# Patient Record
Sex: Female | Born: 1945
Health system: Southern US, Community
[De-identification: ages and names within clinical notes are randomized; demographics above are authoritative.]

## PROBLEM LIST (undated history)

## (undated) DIAGNOSIS — K219 Gastro-esophageal reflux disease without esophagitis: Secondary | ICD-10-CM

## (undated) DIAGNOSIS — R000 Tachycardia, unspecified: Secondary | ICD-10-CM

## (undated) DIAGNOSIS — E785 Hyperlipidemia, unspecified: Secondary | ICD-10-CM

## (undated) DIAGNOSIS — I10 Essential (primary) hypertension: Secondary | ICD-10-CM

## (undated) DIAGNOSIS — QA00102 CACNA1A-related neurodevelopmental disorder: Secondary | ICD-10-CM

## (undated) DIAGNOSIS — G118 Other hereditary ataxias: Secondary | ICD-10-CM

## (undated) HISTORY — DX: Hyperlipidemia, unspecified: E78.5

## (undated) HISTORY — DX: Gastro-esophageal reflux disease without esophagitis: K21.9

## (undated) HISTORY — DX: Tachycardia, unspecified: R00.0

## (undated) HISTORY — PX: CATARACT EXTRACTION, BILATERAL: SHX1313

## (undated) HISTORY — DX: CACNA1A-related neurodevelopmental disorder: QA0.0102

## (undated) HISTORY — DX: Essential (primary) hypertension: I10

## (undated) HISTORY — PX: TOTAL HIP ARTHROPLASTY: SHX124

## (undated) HISTORY — DX: Other hereditary ataxias: G11.8

---

## 2014-04-25 DIAGNOSIS — H3532 Exudative age-related macular degeneration: Secondary | ICD-10-CM | POA: Diagnosis not present

## 2014-05-23 DIAGNOSIS — B354 Tinea corporis: Secondary | ICD-10-CM | POA: Diagnosis not present

## 2014-06-27 DIAGNOSIS — H3532 Exudative age-related macular degeneration: Secondary | ICD-10-CM | POA: Diagnosis not present

## 2014-06-27 DIAGNOSIS — H3531 Nonexudative age-related macular degeneration: Secondary | ICD-10-CM | POA: Diagnosis not present

## 2014-07-25 DIAGNOSIS — H25813 Combined forms of age-related cataract, bilateral: Secondary | ICD-10-CM | POA: Diagnosis not present

## 2014-08-07 DIAGNOSIS — H25813 Combined forms of age-related cataract, bilateral: Secondary | ICD-10-CM | POA: Diagnosis not present

## 2014-08-07 DIAGNOSIS — H25811 Combined forms of age-related cataract, right eye: Secondary | ICD-10-CM | POA: Diagnosis not present

## 2014-08-14 DIAGNOSIS — M1711 Unilateral primary osteoarthritis, right knee: Secondary | ICD-10-CM | POA: Diagnosis not present

## 2014-08-14 DIAGNOSIS — Z96641 Presence of right artificial hip joint: Secondary | ICD-10-CM | POA: Diagnosis not present

## 2014-08-22 DIAGNOSIS — H25811 Combined forms of age-related cataract, right eye: Secondary | ICD-10-CM | POA: Diagnosis not present

## 2014-09-05 DIAGNOSIS — H3532 Exudative age-related macular degeneration: Secondary | ICD-10-CM | POA: Diagnosis not present

## 2014-10-02 DIAGNOSIS — M1711 Unilateral primary osteoarthritis, right knee: Secondary | ICD-10-CM | POA: Diagnosis not present

## 2014-10-02 DIAGNOSIS — M1611 Unilateral primary osteoarthritis, right hip: Secondary | ICD-10-CM | POA: Diagnosis not present

## 2014-10-09 DIAGNOSIS — H25812 Combined forms of age-related cataract, left eye: Secondary | ICD-10-CM | POA: Diagnosis not present

## 2014-10-31 DIAGNOSIS — H25812 Combined forms of age-related cataract, left eye: Secondary | ICD-10-CM | POA: Diagnosis not present

## 2014-11-14 DIAGNOSIS — H3532 Exudative age-related macular degeneration: Secondary | ICD-10-CM | POA: Diagnosis not present

## 2014-11-14 DIAGNOSIS — H3531 Nonexudative age-related macular degeneration: Secondary | ICD-10-CM | POA: Diagnosis not present

## 2014-11-15 DIAGNOSIS — K219 Gastro-esophageal reflux disease without esophagitis: Secondary | ICD-10-CM | POA: Diagnosis not present

## 2014-11-15 DIAGNOSIS — I10 Essential (primary) hypertension: Secondary | ICD-10-CM | POA: Diagnosis not present

## 2014-11-15 DIAGNOSIS — Z23 Encounter for immunization: Secondary | ICD-10-CM | POA: Diagnosis not present

## 2014-11-15 DIAGNOSIS — E785 Hyperlipidemia, unspecified: Secondary | ICD-10-CM | POA: Diagnosis not present

## 2014-12-11 DIAGNOSIS — E119 Type 2 diabetes mellitus without complications: Secondary | ICD-10-CM | POA: Diagnosis not present

## 2014-12-11 DIAGNOSIS — K219 Gastro-esophageal reflux disease without esophagitis: Secondary | ICD-10-CM | POA: Diagnosis not present

## 2014-12-11 DIAGNOSIS — R0602 Shortness of breath: Secondary | ICD-10-CM | POA: Diagnosis not present

## 2014-12-11 DIAGNOSIS — I10 Essential (primary) hypertension: Secondary | ICD-10-CM | POA: Diagnosis not present

## 2014-12-11 DIAGNOSIS — Z87891 Personal history of nicotine dependence: Secondary | ICD-10-CM | POA: Diagnosis not present

## 2014-12-11 DIAGNOSIS — G119 Hereditary ataxia, unspecified: Secondary | ICD-10-CM | POA: Diagnosis not present

## 2014-12-11 DIAGNOSIS — R002 Palpitations: Secondary | ICD-10-CM | POA: Diagnosis not present

## 2014-12-13 DIAGNOSIS — R002 Palpitations: Secondary | ICD-10-CM | POA: Diagnosis not present

## 2014-12-19 DIAGNOSIS — R002 Palpitations: Secondary | ICD-10-CM | POA: Diagnosis not present

## 2015-01-01 DIAGNOSIS — M859 Disorder of bone density and structure, unspecified: Secondary | ICD-10-CM | POA: Diagnosis not present

## 2015-01-01 DIAGNOSIS — Z13820 Encounter for screening for osteoporosis: Secondary | ICD-10-CM | POA: Diagnosis not present

## 2015-01-01 DIAGNOSIS — M8580 Other specified disorders of bone density and structure, unspecified site: Secondary | ICD-10-CM | POA: Diagnosis not present

## 2015-01-18 DIAGNOSIS — R002 Palpitations: Secondary | ICD-10-CM | POA: Diagnosis not present

## 2015-01-23 DIAGNOSIS — H353212 Exudative age-related macular degeneration, right eye, with inactive choroidal neovascularization: Secondary | ICD-10-CM | POA: Diagnosis not present

## 2015-01-25 DIAGNOSIS — I471 Supraventricular tachycardia: Secondary | ICD-10-CM | POA: Diagnosis not present

## 2015-02-20 DIAGNOSIS — H9190 Unspecified hearing loss, unspecified ear: Secondary | ICD-10-CM | POA: Diagnosis not present

## 2015-02-20 DIAGNOSIS — Z23 Encounter for immunization: Secondary | ICD-10-CM | POA: Diagnosis not present

## 2015-02-28 DIAGNOSIS — H906 Mixed conductive and sensorineural hearing loss, bilateral: Secondary | ICD-10-CM | POA: Diagnosis not present

## 2015-03-15 DIAGNOSIS — R05 Cough: Secondary | ICD-10-CM | POA: Diagnosis not present

## 2015-03-15 DIAGNOSIS — H6693 Otitis media, unspecified, bilateral: Secondary | ICD-10-CM | POA: Diagnosis not present

## 2015-03-23 DIAGNOSIS — Z87891 Personal history of nicotine dependence: Secondary | ICD-10-CM | POA: Diagnosis not present

## 2015-03-23 DIAGNOSIS — J209 Acute bronchitis, unspecified: Secondary | ICD-10-CM | POA: Diagnosis not present

## 2015-03-23 DIAGNOSIS — J40 Bronchitis, not specified as acute or chronic: Secondary | ICD-10-CM | POA: Diagnosis not present

## 2015-03-23 DIAGNOSIS — R0602 Shortness of breath: Secondary | ICD-10-CM | POA: Diagnosis not present

## 2015-03-24 DIAGNOSIS — R0602 Shortness of breath: Secondary | ICD-10-CM | POA: Diagnosis not present

## 2015-03-27 DIAGNOSIS — J40 Bronchitis, not specified as acute or chronic: Secondary | ICD-10-CM | POA: Diagnosis not present

## 2015-04-03 DIAGNOSIS — H353212 Exudative age-related macular degeneration, right eye, with inactive choroidal neovascularization: Secondary | ICD-10-CM | POA: Diagnosis not present

## 2015-04-05 DIAGNOSIS — Z1231 Encounter for screening mammogram for malignant neoplasm of breast: Secondary | ICD-10-CM | POA: Diagnosis not present

## 2015-06-12 DIAGNOSIS — H353212 Exudative age-related macular degeneration, right eye, with inactive choroidal neovascularization: Secondary | ICD-10-CM | POA: Diagnosis not present

## 2015-07-11 DIAGNOSIS — G119 Hereditary ataxia, unspecified: Secondary | ICD-10-CM | POA: Diagnosis not present

## 2015-07-11 DIAGNOSIS — R278 Other lack of coordination: Secondary | ICD-10-CM | POA: Diagnosis not present

## 2015-07-11 DIAGNOSIS — M6281 Muscle weakness (generalized): Secondary | ICD-10-CM | POA: Diagnosis not present

## 2015-07-11 DIAGNOSIS — R2689 Other abnormalities of gait and mobility: Secondary | ICD-10-CM | POA: Diagnosis not present

## 2015-07-13 DIAGNOSIS — R2689 Other abnormalities of gait and mobility: Secondary | ICD-10-CM | POA: Diagnosis not present

## 2015-07-13 DIAGNOSIS — M6281 Muscle weakness (generalized): Secondary | ICD-10-CM | POA: Diagnosis not present

## 2015-07-13 DIAGNOSIS — R278 Other lack of coordination: Secondary | ICD-10-CM | POA: Diagnosis not present

## 2015-07-13 DIAGNOSIS — G119 Hereditary ataxia, unspecified: Secondary | ICD-10-CM | POA: Diagnosis not present

## 2015-07-16 DIAGNOSIS — R293 Abnormal posture: Secondary | ICD-10-CM | POA: Diagnosis not present

## 2015-07-16 DIAGNOSIS — M6281 Muscle weakness (generalized): Secondary | ICD-10-CM | POA: Diagnosis not present

## 2015-07-16 DIAGNOSIS — R278 Other lack of coordination: Secondary | ICD-10-CM | POA: Diagnosis not present

## 2015-07-16 DIAGNOSIS — G119 Hereditary ataxia, unspecified: Secondary | ICD-10-CM | POA: Diagnosis not present

## 2015-07-16 DIAGNOSIS — R2689 Other abnormalities of gait and mobility: Secondary | ICD-10-CM | POA: Diagnosis not present

## 2015-07-18 DIAGNOSIS — M6281 Muscle weakness (generalized): Secondary | ICD-10-CM | POA: Diagnosis not present

## 2015-07-18 DIAGNOSIS — R2689 Other abnormalities of gait and mobility: Secondary | ICD-10-CM | POA: Diagnosis not present

## 2015-07-18 DIAGNOSIS — R293 Abnormal posture: Secondary | ICD-10-CM | POA: Diagnosis not present

## 2015-07-18 DIAGNOSIS — G119 Hereditary ataxia, unspecified: Secondary | ICD-10-CM | POA: Diagnosis not present

## 2015-07-18 DIAGNOSIS — R278 Other lack of coordination: Secondary | ICD-10-CM | POA: Diagnosis not present

## 2015-07-19 DIAGNOSIS — R278 Other lack of coordination: Secondary | ICD-10-CM | POA: Diagnosis not present

## 2015-07-19 DIAGNOSIS — R2689 Other abnormalities of gait and mobility: Secondary | ICD-10-CM | POA: Diagnosis not present

## 2015-07-19 DIAGNOSIS — R293 Abnormal posture: Secondary | ICD-10-CM | POA: Diagnosis not present

## 2015-07-19 DIAGNOSIS — G119 Hereditary ataxia, unspecified: Secondary | ICD-10-CM | POA: Diagnosis not present

## 2015-07-19 DIAGNOSIS — M6281 Muscle weakness (generalized): Secondary | ICD-10-CM | POA: Diagnosis not present

## 2015-07-23 DIAGNOSIS — R278 Other lack of coordination: Secondary | ICD-10-CM | POA: Diagnosis not present

## 2015-07-23 DIAGNOSIS — R2689 Other abnormalities of gait and mobility: Secondary | ICD-10-CM | POA: Diagnosis not present

## 2015-07-23 DIAGNOSIS — M6281 Muscle weakness (generalized): Secondary | ICD-10-CM | POA: Diagnosis not present

## 2015-07-23 DIAGNOSIS — R293 Abnormal posture: Secondary | ICD-10-CM | POA: Diagnosis not present

## 2015-07-23 DIAGNOSIS — G119 Hereditary ataxia, unspecified: Secondary | ICD-10-CM | POA: Diagnosis not present

## 2015-07-25 DIAGNOSIS — R278 Other lack of coordination: Secondary | ICD-10-CM | POA: Diagnosis not present

## 2015-07-25 DIAGNOSIS — G119 Hereditary ataxia, unspecified: Secondary | ICD-10-CM | POA: Diagnosis not present

## 2015-07-25 DIAGNOSIS — M6281 Muscle weakness (generalized): Secondary | ICD-10-CM | POA: Diagnosis not present

## 2015-07-25 DIAGNOSIS — R2689 Other abnormalities of gait and mobility: Secondary | ICD-10-CM | POA: Diagnosis not present

## 2015-07-25 DIAGNOSIS — R293 Abnormal posture: Secondary | ICD-10-CM | POA: Diagnosis not present

## 2015-07-27 DIAGNOSIS — G119 Hereditary ataxia, unspecified: Secondary | ICD-10-CM | POA: Diagnosis not present

## 2015-07-27 DIAGNOSIS — R278 Other lack of coordination: Secondary | ICD-10-CM | POA: Diagnosis not present

## 2015-07-27 DIAGNOSIS — R293 Abnormal posture: Secondary | ICD-10-CM | POA: Diagnosis not present

## 2015-07-27 DIAGNOSIS — R2689 Other abnormalities of gait and mobility: Secondary | ICD-10-CM | POA: Diagnosis not present

## 2015-07-27 DIAGNOSIS — M6281 Muscle weakness (generalized): Secondary | ICD-10-CM | POA: Diagnosis not present

## 2015-07-30 DIAGNOSIS — R293 Abnormal posture: Secondary | ICD-10-CM | POA: Diagnosis not present

## 2015-07-30 DIAGNOSIS — R2689 Other abnormalities of gait and mobility: Secondary | ICD-10-CM | POA: Diagnosis not present

## 2015-07-30 DIAGNOSIS — R278 Other lack of coordination: Secondary | ICD-10-CM | POA: Diagnosis not present

## 2015-07-30 DIAGNOSIS — M6281 Muscle weakness (generalized): Secondary | ICD-10-CM | POA: Diagnosis not present

## 2015-07-30 DIAGNOSIS — G119 Hereditary ataxia, unspecified: Secondary | ICD-10-CM | POA: Diagnosis not present

## 2015-07-31 DIAGNOSIS — G119 Hereditary ataxia, unspecified: Secondary | ICD-10-CM | POA: Diagnosis not present

## 2015-07-31 DIAGNOSIS — R293 Abnormal posture: Secondary | ICD-10-CM | POA: Diagnosis not present

## 2015-07-31 DIAGNOSIS — R2689 Other abnormalities of gait and mobility: Secondary | ICD-10-CM | POA: Diagnosis not present

## 2015-07-31 DIAGNOSIS — R278 Other lack of coordination: Secondary | ICD-10-CM | POA: Diagnosis not present

## 2015-07-31 DIAGNOSIS — M6281 Muscle weakness (generalized): Secondary | ICD-10-CM | POA: Diagnosis not present

## 2015-08-01 DIAGNOSIS — R293 Abnormal posture: Secondary | ICD-10-CM | POA: Diagnosis not present

## 2015-08-01 DIAGNOSIS — R278 Other lack of coordination: Secondary | ICD-10-CM | POA: Diagnosis not present

## 2015-08-01 DIAGNOSIS — G119 Hereditary ataxia, unspecified: Secondary | ICD-10-CM | POA: Diagnosis not present

## 2015-08-01 DIAGNOSIS — M6281 Muscle weakness (generalized): Secondary | ICD-10-CM | POA: Diagnosis not present

## 2015-08-01 DIAGNOSIS — R2689 Other abnormalities of gait and mobility: Secondary | ICD-10-CM | POA: Diagnosis not present

## 2015-08-02 DIAGNOSIS — G119 Hereditary ataxia, unspecified: Secondary | ICD-10-CM | POA: Diagnosis not present

## 2015-08-02 DIAGNOSIS — R293 Abnormal posture: Secondary | ICD-10-CM | POA: Diagnosis not present

## 2015-08-02 DIAGNOSIS — R2689 Other abnormalities of gait and mobility: Secondary | ICD-10-CM | POA: Diagnosis not present

## 2015-08-02 DIAGNOSIS — E7800 Pure hypercholesterolemia, unspecified: Secondary | ICD-10-CM | POA: Diagnosis not present

## 2015-08-02 DIAGNOSIS — I10 Essential (primary) hypertension: Secondary | ICD-10-CM | POA: Diagnosis not present

## 2015-08-02 DIAGNOSIS — Z79899 Other long term (current) drug therapy: Secondary | ICD-10-CM | POA: Diagnosis not present

## 2015-08-02 DIAGNOSIS — R278 Other lack of coordination: Secondary | ICD-10-CM | POA: Diagnosis not present

## 2015-08-02 DIAGNOSIS — M6281 Muscle weakness (generalized): Secondary | ICD-10-CM | POA: Diagnosis not present

## 2015-08-06 DIAGNOSIS — M6281 Muscle weakness (generalized): Secondary | ICD-10-CM | POA: Diagnosis not present

## 2015-08-06 DIAGNOSIS — R278 Other lack of coordination: Secondary | ICD-10-CM | POA: Diagnosis not present

## 2015-08-06 DIAGNOSIS — R2689 Other abnormalities of gait and mobility: Secondary | ICD-10-CM | POA: Diagnosis not present

## 2015-08-06 DIAGNOSIS — R293 Abnormal posture: Secondary | ICD-10-CM | POA: Diagnosis not present

## 2015-08-06 DIAGNOSIS — G119 Hereditary ataxia, unspecified: Secondary | ICD-10-CM | POA: Diagnosis not present

## 2015-08-08 DIAGNOSIS — M6281 Muscle weakness (generalized): Secondary | ICD-10-CM | POA: Diagnosis not present

## 2015-08-08 DIAGNOSIS — R293 Abnormal posture: Secondary | ICD-10-CM | POA: Diagnosis not present

## 2015-08-08 DIAGNOSIS — G119 Hereditary ataxia, unspecified: Secondary | ICD-10-CM | POA: Diagnosis not present

## 2015-08-08 DIAGNOSIS — R278 Other lack of coordination: Secondary | ICD-10-CM | POA: Diagnosis not present

## 2015-08-08 DIAGNOSIS — R2689 Other abnormalities of gait and mobility: Secondary | ICD-10-CM | POA: Diagnosis not present

## 2015-08-10 DIAGNOSIS — R278 Other lack of coordination: Secondary | ICD-10-CM | POA: Diagnosis not present

## 2015-08-10 DIAGNOSIS — R2689 Other abnormalities of gait and mobility: Secondary | ICD-10-CM | POA: Diagnosis not present

## 2015-08-10 DIAGNOSIS — M6281 Muscle weakness (generalized): Secondary | ICD-10-CM | POA: Diagnosis not present

## 2015-08-10 DIAGNOSIS — R293 Abnormal posture: Secondary | ICD-10-CM | POA: Diagnosis not present

## 2015-08-10 DIAGNOSIS — G119 Hereditary ataxia, unspecified: Secondary | ICD-10-CM | POA: Diagnosis not present

## 2015-08-13 DIAGNOSIS — M6281 Muscle weakness (generalized): Secondary | ICD-10-CM | POA: Diagnosis not present

## 2015-08-13 DIAGNOSIS — G119 Hereditary ataxia, unspecified: Secondary | ICD-10-CM | POA: Diagnosis not present

## 2015-08-13 DIAGNOSIS — R2689 Other abnormalities of gait and mobility: Secondary | ICD-10-CM | POA: Diagnosis not present

## 2015-08-13 DIAGNOSIS — R278 Other lack of coordination: Secondary | ICD-10-CM | POA: Diagnosis not present

## 2015-08-13 DIAGNOSIS — R293 Abnormal posture: Secondary | ICD-10-CM | POA: Diagnosis not present

## 2015-08-16 DIAGNOSIS — M6281 Muscle weakness (generalized): Secondary | ICD-10-CM | POA: Diagnosis not present

## 2015-08-16 DIAGNOSIS — R293 Abnormal posture: Secondary | ICD-10-CM | POA: Diagnosis not present

## 2015-08-16 DIAGNOSIS — R278 Other lack of coordination: Secondary | ICD-10-CM | POA: Diagnosis not present

## 2015-08-16 DIAGNOSIS — R2689 Other abnormalities of gait and mobility: Secondary | ICD-10-CM | POA: Diagnosis not present

## 2015-08-16 DIAGNOSIS — G119 Hereditary ataxia, unspecified: Secondary | ICD-10-CM | POA: Diagnosis not present

## 2015-08-20 DIAGNOSIS — M6281 Muscle weakness (generalized): Secondary | ICD-10-CM | POA: Diagnosis not present

## 2015-08-20 DIAGNOSIS — R2689 Other abnormalities of gait and mobility: Secondary | ICD-10-CM | POA: Diagnosis not present

## 2015-08-20 DIAGNOSIS — R293 Abnormal posture: Secondary | ICD-10-CM | POA: Diagnosis not present

## 2015-08-20 DIAGNOSIS — G119 Hereditary ataxia, unspecified: Secondary | ICD-10-CM | POA: Diagnosis not present

## 2015-08-20 DIAGNOSIS — R278 Other lack of coordination: Secondary | ICD-10-CM | POA: Diagnosis not present

## 2015-08-21 DIAGNOSIS — H353212 Exudative age-related macular degeneration, right eye, with inactive choroidal neovascularization: Secondary | ICD-10-CM | POA: Diagnosis not present

## 2015-08-21 DIAGNOSIS — H353122 Nonexudative age-related macular degeneration, left eye, intermediate dry stage: Secondary | ICD-10-CM | POA: Diagnosis not present

## 2015-08-22 DIAGNOSIS — R278 Other lack of coordination: Secondary | ICD-10-CM | POA: Diagnosis not present

## 2015-08-22 DIAGNOSIS — R293 Abnormal posture: Secondary | ICD-10-CM | POA: Diagnosis not present

## 2015-08-22 DIAGNOSIS — M6281 Muscle weakness (generalized): Secondary | ICD-10-CM | POA: Diagnosis not present

## 2015-08-22 DIAGNOSIS — R2689 Other abnormalities of gait and mobility: Secondary | ICD-10-CM | POA: Diagnosis not present

## 2015-08-22 DIAGNOSIS — G119 Hereditary ataxia, unspecified: Secondary | ICD-10-CM | POA: Diagnosis not present

## 2015-08-24 DIAGNOSIS — R293 Abnormal posture: Secondary | ICD-10-CM | POA: Diagnosis not present

## 2015-08-24 DIAGNOSIS — R2689 Other abnormalities of gait and mobility: Secondary | ICD-10-CM | POA: Diagnosis not present

## 2015-08-24 DIAGNOSIS — R278 Other lack of coordination: Secondary | ICD-10-CM | POA: Diagnosis not present

## 2015-08-24 DIAGNOSIS — G119 Hereditary ataxia, unspecified: Secondary | ICD-10-CM | POA: Diagnosis not present

## 2015-08-24 DIAGNOSIS — M6281 Muscle weakness (generalized): Secondary | ICD-10-CM | POA: Diagnosis not present

## 2015-08-27 DIAGNOSIS — R293 Abnormal posture: Secondary | ICD-10-CM | POA: Diagnosis not present

## 2015-08-27 DIAGNOSIS — R2689 Other abnormalities of gait and mobility: Secondary | ICD-10-CM | POA: Diagnosis not present

## 2015-08-27 DIAGNOSIS — G119 Hereditary ataxia, unspecified: Secondary | ICD-10-CM | POA: Diagnosis not present

## 2015-08-27 DIAGNOSIS — R278 Other lack of coordination: Secondary | ICD-10-CM | POA: Diagnosis not present

## 2015-08-27 DIAGNOSIS — M6281 Muscle weakness (generalized): Secondary | ICD-10-CM | POA: Diagnosis not present

## 2015-11-27 DIAGNOSIS — H353122 Nonexudative age-related macular degeneration, left eye, intermediate dry stage: Secondary | ICD-10-CM | POA: Diagnosis not present

## 2015-11-27 DIAGNOSIS — H353212 Exudative age-related macular degeneration, right eye, with inactive choroidal neovascularization: Secondary | ICD-10-CM | POA: Diagnosis not present

## 2015-11-29 DIAGNOSIS — I10 Essential (primary) hypertension: Secondary | ICD-10-CM | POA: Diagnosis not present

## 2015-11-29 DIAGNOSIS — Z6841 Body Mass Index (BMI) 40.0 and over, adult: Secondary | ICD-10-CM | POA: Diagnosis not present

## 2015-11-29 DIAGNOSIS — G119 Hereditary ataxia, unspecified: Secondary | ICD-10-CM | POA: Diagnosis not present

## 2015-11-29 DIAGNOSIS — E7800 Pure hypercholesterolemia, unspecified: Secondary | ICD-10-CM | POA: Diagnosis not present

## 2015-11-29 DIAGNOSIS — Z79899 Other long term (current) drug therapy: Secondary | ICD-10-CM | POA: Diagnosis not present

## 2016-01-22 DIAGNOSIS — H353212 Exudative age-related macular degeneration, right eye, with inactive choroidal neovascularization: Secondary | ICD-10-CM | POA: Diagnosis not present

## 2016-01-23 DIAGNOSIS — Z23 Encounter for immunization: Secondary | ICD-10-CM | POA: Diagnosis not present

## 2016-03-27 DIAGNOSIS — I10 Essential (primary) hypertension: Secondary | ICD-10-CM | POA: Diagnosis not present

## 2016-03-27 DIAGNOSIS — Z6838 Body mass index (BMI) 38.0-38.9, adult: Secondary | ICD-10-CM | POA: Diagnosis not present

## 2016-03-27 DIAGNOSIS — G119 Hereditary ataxia, unspecified: Secondary | ICD-10-CM | POA: Diagnosis not present

## 2016-03-27 DIAGNOSIS — E7800 Pure hypercholesterolemia, unspecified: Secondary | ICD-10-CM | POA: Diagnosis not present

## 2016-03-27 DIAGNOSIS — E669 Obesity, unspecified: Secondary | ICD-10-CM | POA: Diagnosis not present

## 2016-03-27 DIAGNOSIS — R000 Tachycardia, unspecified: Secondary | ICD-10-CM | POA: Diagnosis not present

## 2016-03-27 DIAGNOSIS — N3941 Urge incontinence: Secondary | ICD-10-CM | POA: Diagnosis not present

## 2016-03-27 DIAGNOSIS — Z1389 Encounter for screening for other disorder: Secondary | ICD-10-CM | POA: Diagnosis not present

## 2016-03-27 DIAGNOSIS — Z0000 Encounter for general adult medical examination without abnormal findings: Secondary | ICD-10-CM | POA: Diagnosis not present

## 2016-04-22 DIAGNOSIS — H353212 Exudative age-related macular degeneration, right eye, with inactive choroidal neovascularization: Secondary | ICD-10-CM | POA: Diagnosis not present

## 2016-05-07 DIAGNOSIS — J069 Acute upper respiratory infection, unspecified: Secondary | ICD-10-CM | POA: Diagnosis not present

## 2016-07-01 DIAGNOSIS — D1801 Hemangioma of skin and subcutaneous tissue: Secondary | ICD-10-CM | POA: Diagnosis not present

## 2016-07-01 DIAGNOSIS — L821 Other seborrheic keratosis: Secondary | ICD-10-CM | POA: Diagnosis not present

## 2016-07-01 DIAGNOSIS — L720 Epidermal cyst: Secondary | ICD-10-CM | POA: Diagnosis not present

## 2016-07-08 DIAGNOSIS — I10 Essential (primary) hypertension: Secondary | ICD-10-CM | POA: Diagnosis not present

## 2016-07-08 DIAGNOSIS — G119 Hereditary ataxia, unspecified: Secondary | ICD-10-CM | POA: Diagnosis not present

## 2016-07-08 DIAGNOSIS — E669 Obesity, unspecified: Secondary | ICD-10-CM | POA: Diagnosis not present

## 2016-09-11 DIAGNOSIS — D3132 Benign neoplasm of left choroid: Secondary | ICD-10-CM | POA: Diagnosis not present

## 2016-09-11 DIAGNOSIS — H353213 Exudative age-related macular degeneration, right eye, with inactive scar: Secondary | ICD-10-CM | POA: Diagnosis not present

## 2016-09-11 DIAGNOSIS — H353122 Nonexudative age-related macular degeneration, left eye, intermediate dry stage: Secondary | ICD-10-CM | POA: Diagnosis not present

## 2016-09-11 DIAGNOSIS — H35372 Puckering of macula, left eye: Secondary | ICD-10-CM | POA: Diagnosis not present

## 2016-09-25 DIAGNOSIS — Z0000 Encounter for general adult medical examination without abnormal findings: Secondary | ICD-10-CM | POA: Diagnosis not present

## 2016-09-25 DIAGNOSIS — I10 Essential (primary) hypertension: Secondary | ICD-10-CM | POA: Diagnosis not present

## 2016-09-25 DIAGNOSIS — Z79899 Other long term (current) drug therapy: Secondary | ICD-10-CM | POA: Diagnosis not present

## 2016-09-25 DIAGNOSIS — G119 Hereditary ataxia, unspecified: Secondary | ICD-10-CM | POA: Diagnosis not present

## 2016-10-02 ENCOUNTER — Encounter: Payer: Self-pay | Admitting: Neurology

## 2016-10-30 DIAGNOSIS — D126 Benign neoplasm of colon, unspecified: Secondary | ICD-10-CM | POA: Diagnosis not present

## 2016-10-31 ENCOUNTER — Encounter: Payer: Self-pay | Admitting: Neurology

## 2016-10-31 NOTE — Progress Notes (Signed)
Andrea Arroyo was seen today in the movement disorders clinic for neurologic consultation at the request of [name], MD.  The consultation is for the evaluation of cerebellar ataxia.  She has apparently been evaluated for the same previously in [place] but I don't have any of those records.  Pt states that the first sx came on when she was about 71 years old.  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  Over a few years, sx's progressed to involve speech and significant walking trouble.  She has not been able to walk unassisted since about 2003.  She fell and fx the right hip about 7 years ago and husband states that she was able to get back to her baseline thereafter.  She lives independently with her husband at [place].  She has no trouble swallowing but does state that "I have a lot of coughing with liquids or with watermelon."  Husband says that she does well with taking her medication.  She has no diplopia.  She does have macular degeneration.    In order to get the bathroom, she ambulates with her husband with a walker (rollator).  She also has a power WC which she uses at [employer].  She is able to steer straight with the power wheelchair, but is having some trouble when she has to steer around objects or people are with her, primarily because of the ataxia when she has to grab for control.  She has no cramping of the limbs.  No paresthesias.  Pool 2 days a week for exercise and is going to be starting PT.  Has had ST in the past and she doesn't think that it was helpful.    Reports that her mother had similar sx's but they called it MS.  Her maternal uncle, 2 maternal aunts and her brother all have same sx's.  She has 3 children and none of them have been affected (at this point).    ALLERGIES:   Allergies  Allergen Reactions  . Penicillins Rash    CURRENT MEDICATIONS:  Outpatient Encounter Prescriptions as of 11/03/2016    Medication Sig  . CALCIUM PO Take by mouth daily.  . Cholecalciferol (VITAMIN D PO) Take 5,000 Units by mouth daily.  Marland Kitchen losartan (COZAAR) 50 MG tablet Take 50 mg by mouth daily.  . multivitamin-lutein (OCUVITE-LUTEIN) CAPS capsule Take 1 capsule by mouth daily.  Marland Kitchen MYRBETRIQ 25 MG TB24 tablet Take 25 mg by mouth daily.  Marland Kitchen omeprazole (PRILOSEC) 20 MG capsule Take 20 mg by mouth daily.  . simvastatin (ZOCOR) 20 MG tablet Take 20 mg by mouth daily.  Marland Kitchen triamcinolone ointment (KENALOG) 0.1 % Apply 1 application topically 2 (two) times daily.  . verapamil (CALAN-SR) 240 MG CR tablet Take 240 mg by mouth at bedtime.   No facility-administered encounter medications on file as of 11/03/2016.     PAST MEDICAL HISTORY:   Past Medical History:  Diagnosis Date  . GERD (gastroesophageal reflux disease)   . Hyperlipidemia   . Hypertension   . Rapid heart rate   . SCA-6 (spinocerebellar ataxia type 6) ([place])     PAST SURGICAL HISTORY:   Past Surgical History:  Procedure Laterality Date  . CATARACT EXTRACTION, BILATERAL    . TOTAL HIP ARTHROPLASTY Right     SOCIAL HISTORY:   Social History   Social History  . Marital status: Married    Spouse name: N/A  .  Number of children: N/A  . Years of education: N/A   Occupational History  . retired     Scientist, research (medical); [employer] fundraiser   Social History Main Topics  . Smoking status: Former Smoker    Quit date: 11/04/2006  . Smokeless tobacco: Never Used  . Alcohol use No  . Drug use: No  . Sexual activity: Not on file   Other Topics Concern  . Not on file   Social History Narrative  . No narrative on file    FAMILY HISTORY:   Family Status  Relation Status  . Mother Deceased       SCA 6  . Father Deceased  . Sister Alive  . Brother Alive       SCA 6  . Child Alive    ROS:  A complete 10 system review of systems was obtained and was unremarkable apart from what is mentioned above.  PHYSICAL EXAMINATION:    VITALS:   Vitals:    11/03/16 1411  BP: 118/62  Pulse: 60  SpO2: 95%  Weight: 221 lb (100.2 kg)  Height: 5\' 3"  (1.6 m)    GEN:  The patient appears stated age and is in NAD. HEENT:  Normocephalic, atraumatic.  The mucous membranes are moist. The superficial temporal arteries are without ropiness or tenderness. CV:  RRR Lungs:  CTAB Neck/HEME:  There are no carotid bruits bilaterally.  Neurological examination:  Orientation: The patient is alert and oriented x3. Fund of knowledge is appropriate.  Recent and remote memory are intact.  Attention and concentration are normal.    Able to name objects and repeat phrases. Cranial nerves: There is good facial symmetry. Pupils are equal round and reactive to light bilaterally. Fundoscopic exam reveals clear margins bilaterally. Extraocular muscles are intact.  There is overshoot dysmetria.  There is horizontal nystagmus.  The visual fields are full to confrontational testing. The speech is fluent and pseudobulbar in quality. Soft palate rises symmetrically and there is no tongue deviation. Hearing is intact to conversational tone. Sensation: Sensation is intact to light and pinprick throughout (facial, trunk, extremities). Vibration is intact at the bilateral big toe. There is no extinction with double simultaneous stimulation. There is no sensory dermatomal level identified. Motor: Strength is 5/5 in the bilateral upper and lower extremities.   Shoulder shrug is equal and symmetric.  There is no pronator drift. Deep tendon reflexes: Deep tendon reflexes are 2+/4 at the bilateral biceps, triceps, brachioradialis, patella and achilles. Plantar responses are downgoing bilaterally.  Movement examination: Tone: There is normal tone in the bilateral upper extremities.  The tone in the lower extremities is normal.  Abnormal movements: None Coordination:  There is no decremation with RAM's, with any form of RAMS, including alternating supination and pronation of the forearm,  hand opening and closing, finger taps, heel taps and toe taps.  She does have significant dysmetria with finger-nose-finger bilaterally that is somewhat asymmetric with the right being worse than the left.  She has trouble with H-S but is able to do it Gait and Station: The patient arises out of the wheelchair by pushing off.  She is given a walker.  She is ataxic even with the walker and requires someone to walk behind her and gently guide her.  ASSESSMENT/PLAN:  1.  Spinocerebellar ataxia type 6  -Discussed the nature and pathophysiology with the patient and her husband.  They understand that this is autosomal dominant.  She has had this for over 20 years.  This has been fairly slowly progressive for her.  She is being very well taking care of by her husband.  They live at [facility], but lived independently and wished to remain that way for as long as they can.  She is in physical therapy.  She has not found speech therapy to be helpful in the past.  She has some pseudobulbar speech and they will let me know if he changes her mind and speech therapy.  I really would like to get a modified barium swallow study given coughing with liquids, but she wants to hold on that.  We talked about risks for aspiration.  They understood.    -I would like to see if we can get her power wheelchair modified somewhat.  It is doubtful that Medicare will allow for this, but it would be of great value for her.  She has such ataxia with the arms now that she did not have in the past that when she is not going straight and needs to maneuver through smaller spaces or people, she is hitting things and is very embarrassed and wants to stay inside because of that.  I would like to see if we can get her chair modified so that her husband can control the chair just in those situations.  Otherwise, she generally has noted difficulty controlling her own chair.  -Her husband talked about mood fluctuations.  We talked about various mood  medications, but ultimately they both decided that nothing was necessary.  They will let me know if they change their mind.  -I do think she has some component of pseudobulbar laughter, which can come with this.  This is not at all bothersome.  We discussed the nature of this today.  2.  She was being followed in [place] yearly.  They would like to continue that here.  They will certainly let me know if new neurologic issues arise.  I will try to get her records from [place].  She was seen at [facility].  Much greater than 50% of this visit was spent in counseling and coordinating care.  Total face to face time:  65 min   Cc:  [name], MD

## 2016-11-03 ENCOUNTER — Encounter: Payer: Self-pay | Admitting: Neurology

## 2016-11-03 ENCOUNTER — Ambulatory Visit (INDEPENDENT_AMBULATORY_CARE_PROVIDER_SITE_OTHER): Payer: Medicare Other | Admitting: Neurology

## 2016-11-03 VITALS — BP 118/62 | HR 60 | Ht 63.0 in | Wt 221.0 lb

## 2016-11-03 DIAGNOSIS — G118 Other hereditary ataxias: Secondary | ICD-10-CM

## 2016-11-04 ENCOUNTER — Telehealth: Payer: Self-pay | Admitting: Neurology

## 2016-11-04 NOTE — Telephone Encounter (Signed)
Spoke with Debbie at [facility] about adding buddy controls on the back of patient's wheelchair since her ataxia sometimes makes it impossible for her to steer the chair effectively. She states to send over information and she would work on it, but insurance rarely pays for this.   Referral for power wheelchair adjustment evaluation faxed to [place] at [facility] at [phone] with confirmation received. She will call patient to discuss.

## 2016-11-24 DIAGNOSIS — K648 Other hemorrhoids: Secondary | ICD-10-CM | POA: Diagnosis not present

## 2016-11-24 DIAGNOSIS — K635 Polyp of colon: Secondary | ICD-10-CM | POA: Diagnosis not present

## 2016-11-24 DIAGNOSIS — D126 Benign neoplasm of colon, unspecified: Secondary | ICD-10-CM | POA: Diagnosis not present

## 2016-11-24 DIAGNOSIS — Z86010 Personal history of colonic polyps: Secondary | ICD-10-CM | POA: Diagnosis not present

## 2016-11-25 DIAGNOSIS — K635 Polyp of colon: Secondary | ICD-10-CM | POA: Diagnosis not present

## 2016-11-25 DIAGNOSIS — D126 Benign neoplasm of colon, unspecified: Secondary | ICD-10-CM | POA: Diagnosis not present

## 2016-12-04 DIAGNOSIS — R293 Abnormal posture: Secondary | ICD-10-CM | POA: Diagnosis not present

## 2016-12-04 DIAGNOSIS — R29818 Other symptoms and signs involving the nervous system: Secondary | ICD-10-CM | POA: Diagnosis not present

## 2016-12-04 DIAGNOSIS — R2689 Other abnormalities of gait and mobility: Secondary | ICD-10-CM | POA: Diagnosis not present

## 2016-12-04 DIAGNOSIS — G119 Hereditary ataxia, unspecified: Secondary | ICD-10-CM | POA: Diagnosis not present

## 2016-12-09 DIAGNOSIS — D3132 Benign neoplasm of left choroid: Secondary | ICD-10-CM | POA: Diagnosis not present

## 2016-12-09 DIAGNOSIS — H353121 Nonexudative age-related macular degeneration, left eye, early dry stage: Secondary | ICD-10-CM | POA: Diagnosis not present

## 2016-12-09 DIAGNOSIS — H43813 Vitreous degeneration, bilateral: Secondary | ICD-10-CM | POA: Diagnosis not present

## 2016-12-09 DIAGNOSIS — H353213 Exudative age-related macular degeneration, right eye, with inactive scar: Secondary | ICD-10-CM | POA: Diagnosis not present

## 2016-12-11 DIAGNOSIS — R2689 Other abnormalities of gait and mobility: Secondary | ICD-10-CM | POA: Diagnosis not present

## 2016-12-11 DIAGNOSIS — G119 Hereditary ataxia, unspecified: Secondary | ICD-10-CM | POA: Diagnosis not present

## 2016-12-11 DIAGNOSIS — R293 Abnormal posture: Secondary | ICD-10-CM | POA: Diagnosis not present

## 2016-12-11 DIAGNOSIS — R29818 Other symptoms and signs involving the nervous system: Secondary | ICD-10-CM | POA: Diagnosis not present

## 2016-12-16 DIAGNOSIS — R293 Abnormal posture: Secondary | ICD-10-CM | POA: Diagnosis not present

## 2016-12-16 DIAGNOSIS — G119 Hereditary ataxia, unspecified: Secondary | ICD-10-CM | POA: Diagnosis not present

## 2016-12-16 DIAGNOSIS — R29818 Other symptoms and signs involving the nervous system: Secondary | ICD-10-CM | POA: Diagnosis not present

## 2016-12-16 DIAGNOSIS — R2689 Other abnormalities of gait and mobility: Secondary | ICD-10-CM | POA: Diagnosis not present

## 2016-12-18 DIAGNOSIS — G119 Hereditary ataxia, unspecified: Secondary | ICD-10-CM | POA: Diagnosis not present

## 2016-12-18 DIAGNOSIS — R2689 Other abnormalities of gait and mobility: Secondary | ICD-10-CM | POA: Diagnosis not present

## 2016-12-18 DIAGNOSIS — R293 Abnormal posture: Secondary | ICD-10-CM | POA: Diagnosis not present

## 2016-12-18 DIAGNOSIS — R29818 Other symptoms and signs involving the nervous system: Secondary | ICD-10-CM | POA: Diagnosis not present

## 2016-12-23 DIAGNOSIS — R2689 Other abnormalities of gait and mobility: Secondary | ICD-10-CM | POA: Diagnosis not present

## 2016-12-23 DIAGNOSIS — R29818 Other symptoms and signs involving the nervous system: Secondary | ICD-10-CM | POA: Diagnosis not present

## 2016-12-23 DIAGNOSIS — R293 Abnormal posture: Secondary | ICD-10-CM | POA: Diagnosis not present

## 2016-12-23 DIAGNOSIS — G119 Hereditary ataxia, unspecified: Secondary | ICD-10-CM | POA: Diagnosis not present

## 2017-01-02 NOTE — Progress Notes (Signed)
Andrea Arroyo was seen today in the movement disorders clinic for neurologic consultation at the request of [name], MD.  The consultation is for the evaluation of cerebellar ataxia.  She has apparently been evaluated for the same previously in [place] but I don't have any of those records.  Pt states that the first sx came on when she was about 71 years old.  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  Over a few years, sx's progressed to involve speech and significant walking trouble.  She has not been able to walk unassisted since about 2003.  She fell and fx the right hip about 7 years ago and husband states that she was able to get back to her baseline thereafter.  She lives independently with her husband at [place].  She has no trouble swallowing but does state that "I have a lot of coughing with liquids or with watermelon."  Husband says that she does well with taking her medication.  She has no diplopia.  She does have macular degeneration.    In order to get the bathroom, she ambulates with her husband with a walker (rollator).  She also has a power WC which she uses at [employer].  She is able to steer straight with the power wheelchair, but is having some trouble when she has to steer around objects or people are with her, primarily because of the ataxia when she has to grab for control.  She has no cramping of the limbs.  No paresthesias.  Pool 2 days a week for exercise and is going to be starting PT.  Has had ST in the past and she doesn't think that it was helpful.    Reports that her mother had similar sx's but they called it MS.  Her maternal uncle, 2 maternal aunts and her brother all have same sx's.  She has 3 children and none of them have been affected (at this point).  01/05/17 update:  Here for power WC eval.  This patient is accompanied in the office by her spouse who supplements the history.  Patient presents for a mobility  evaluation.  I have reviewed and agree with PT evaluation.  Pt needs power WC as she has difficulty getting to bathroom on time.  Her battery on her own chair has warn out after 6 years. Pt has the mental capability to use the power WC and has demonstrated that with the PT.  she cannot use manual WC because of endurance abilities to propel and poor coordination of UE's given her SCA.  .she could not mount a scooter/transfer onto safely, hence making it inappropriate for this patient.   Rollator not appropriate as multiple falls with this and traditional walker.   Pt has the mental capability to use the power WC and has demonstrated that.  she cannot use manual WC because of endurance abilities to propel and poor coordination of UE's. she could not mount a scooter/transfer onto safely, hence making it inappropriate for this patient.   Rollator not appropriate as multiple falls with this and traditional walker.   ALLERGIES:   Allergies  Allergen Reactions  . Penicillins Rash    CURRENT MEDICATIONS:  Outpatient Encounter Prescriptions as of 01/05/2017  Medication Sig  . CALCIUM PO Take by mouth daily.  . Cholecalciferol (VITAMIN D PO) Take 5,000 Units by mouth daily.  Marland Kitchen losartan (COZAAR) 50 MG tablet Take 50 mg by  mouth daily.  . multivitamin-lutein (OCUVITE-LUTEIN) CAPS capsule Take 1 capsule by mouth daily.  Marland Kitchen MYRBETRIQ 25 MG TB24 tablet Take 25 mg by mouth daily.  Marland Kitchen omeprazole (PRILOSEC) 20 MG capsule Take 20 mg by mouth daily.  . simvastatin (ZOCOR) 20 MG tablet Take 20 mg by mouth daily.  Marland Kitchen triamcinolone ointment (KENALOG) 0.1 % Apply 1 application topically 2 (two) times daily.  . verapamil (CALAN-SR) 240 MG CR tablet Take 240 mg by mouth at bedtime.   No facility-administered encounter medications on file as of 01/05/2017.     PAST MEDICAL HISTORY:   Past Medical History:  Diagnosis Date  . GERD (gastroesophageal reflux disease)   . Hyperlipidemia   . Hypertension   . Rapid heart  rate   . SCA-6 (spinocerebellar ataxia type 6) ([place])     PAST SURGICAL HISTORY:   Past Surgical History:  Procedure Laterality Date  . CATARACT EXTRACTION, BILATERAL    . TOTAL HIP ARTHROPLASTY Right     SOCIAL HISTORY:   Social History   Social History  . Marital status: Married    Spouse name: N/A  . Number of children: N/A  . Years of education: N/A   Occupational History  . retired     Scientist, research (medical); [employer] fundraiser   Social History Main Topics  . Smoking status: Former Smoker    Quit date: 11/04/2006  . Smokeless tobacco: Never Used  . Alcohol use No  . Drug use: No  . Sexual activity: Not on file   Other Topics Concern  . Not on file   Social History Narrative  . No narrative on file    FAMILY HISTORY:   Family Status  Relation Status  . Mother Deceased       SCA 6  . Father Deceased  . Sister Alive  . Brother Alive       SCA 6  . Child Alive    ROS:  A complete 10 system review of systems was obtained and was unremarkable apart from what is mentioned above.  PHYSICAL EXAMINATION:    VITALS:   Vitals:   01/05/17 1413  BP: 120/70  Pulse: 60  SpO2: 95%    GEN:  The patient appears stated age and is in NAD. HEENT:  Normocephalic, atraumatic.  The mucous membranes are moist. The superficial temporal arteries are without ropiness or tenderness. CV:  RRR Lungs:  CTAB Neck/HEME:  There are no carotid bruits bilaterally.  Neurological examination:  Orientation: The patient is alert and oriented x3. Fund of knowledge is appropriate.  Recent and remote memory are intact.  Attention and concentration are normal.    Able to name objects and repeat phrases. Cranial nerves: There is good facial symmetry. Pupils are equal round and reactive to light bilaterally. Fundoscopic exam reveals clear margins bilaterally. Extraocular muscles are intact.  There is overshoot dysmetria.  There is horizontal nystagmus.  The visual fields are full to confrontational testing. The  speech is fluent and pseudobulbar in quality. Soft palate rises symmetrically and there is no tongue deviation. Hearing is intact to conversational tone. Sensation: Sensation is intact to light and pinprick throughout (facial, trunk, extremities). Vibration is intact at the bilateral big toe. There is no extinction with double simultaneous stimulation. There is no sensory dermatomal level identified. Motor: Strength is 5/5 in the bilateral upper and lower extremities.   Shoulder shrug is equal and symmetric.  There is no pronator drift.  Movement examination: Tone: There is normal tone  in the bilateral upper extremities.  The tone in the lower extremities is normal.  Abnormal movements: None Coordination:  There is no decremation with RAM's, with any form of RAMS, including alternating supination and pronation of the forearm, hand opening and closing, finger taps, heel taps and toe taps.  She does have significant dysmetria with finger-nose-finger bilaterally that is somewhat asymmetric with the right being worse than the left.  She has trouble with H-S but is able to do it Gait and Station: The patient arises out of the wheelchair by pushing off.  She is given a walker.  She is ataxic but she does require my assistance.    ASSESSMENT/PLAN:  1.  Spinocerebellar ataxia type 6  -Discussed the nature and pathophysiology with the patient and her husband.  They understand that this is autosomal dominant.  She has had this for over 20 years.  This has been fairly slowly progressive for her.  She is being very well taking care of by her husband.  They live at [facility], but lived independently and wished to remain that way for as long as they can.  She is in physical therapy.  She has not found speech therapy to be helpful in the past.  She has some pseudobulbar speech and they will let me know if he changes her mind and speech therapy.  I really would like to get a modified barium swallow study given  coughing with liquids, but she wants to hold on that.  We talked about risks for aspiration.  They understood.    -As above, patient needs a new power wheelchair due to battery not charging properly on hers which is 73 years old.  There are no barriers to use.  She has shown this with current chair.  She is not appropriate for manual chair given ataxia and endurance.  -Her husband talked about mood fluctuations.  We talked about various mood medications, but ultimately they both decided that nothing was necessary.  They will let me know if they change their mind.  -I do think she has some component of pseudobulbar laughter, which can come with this.  This is not at all bothersome.  We discussed the nature of this today.  2.  She will f/u at previously scheduled visit.  Met our PD social worker today.   Cc:  [name], MD

## 2017-01-05 ENCOUNTER — Ambulatory Visit (INDEPENDENT_AMBULATORY_CARE_PROVIDER_SITE_OTHER): Payer: Medicare Other | Admitting: Neurology

## 2017-01-05 ENCOUNTER — Encounter: Payer: Self-pay | Admitting: Neurology

## 2017-01-05 VITALS — BP 120/70 | HR 60

## 2017-01-05 DIAGNOSIS — G118 Other hereditary ataxias: Secondary | ICD-10-CM

## 2017-01-14 DIAGNOSIS — H353122 Nonexudative age-related macular degeneration, left eye, intermediate dry stage: Secondary | ICD-10-CM | POA: Diagnosis not present

## 2017-01-14 DIAGNOSIS — H353213 Exudative age-related macular degeneration, right eye, with inactive scar: Secondary | ICD-10-CM | POA: Diagnosis not present

## 2017-01-14 DIAGNOSIS — D3132 Benign neoplasm of left choroid: Secondary | ICD-10-CM | POA: Diagnosis not present

## 2017-01-20 DIAGNOSIS — Z23 Encounter for immunization: Secondary | ICD-10-CM | POA: Diagnosis not present

## 2017-04-02 ENCOUNTER — Other Ambulatory Visit: Payer: Self-pay | Admitting: Geriatric Medicine

## 2017-04-02 DIAGNOSIS — J309 Allergic rhinitis, unspecified: Secondary | ICD-10-CM | POA: Diagnosis not present

## 2017-04-02 DIAGNOSIS — Z79899 Other long term (current) drug therapy: Secondary | ICD-10-CM | POA: Diagnosis not present

## 2017-04-02 DIAGNOSIS — Z139 Encounter for screening, unspecified: Secondary | ICD-10-CM

## 2017-04-02 DIAGNOSIS — Z6841 Body Mass Index (BMI) 40.0 and over, adult: Secondary | ICD-10-CM | POA: Diagnosis not present

## 2017-04-02 DIAGNOSIS — G119 Hereditary ataxia, unspecified: Secondary | ICD-10-CM | POA: Diagnosis not present

## 2017-04-02 DIAGNOSIS — Z0000 Encounter for general adult medical examination without abnormal findings: Secondary | ICD-10-CM | POA: Diagnosis not present

## 2017-04-02 DIAGNOSIS — E7800 Pure hypercholesterolemia, unspecified: Secondary | ICD-10-CM | POA: Diagnosis not present

## 2017-04-02 DIAGNOSIS — Z1389 Encounter for screening for other disorder: Secondary | ICD-10-CM | POA: Diagnosis not present

## 2017-04-02 DIAGNOSIS — Z1239 Encounter for other screening for malignant neoplasm of breast: Secondary | ICD-10-CM | POA: Diagnosis not present

## 2017-04-02 DIAGNOSIS — I10 Essential (primary) hypertension: Secondary | ICD-10-CM | POA: Diagnosis not present

## 2017-04-08 DIAGNOSIS — H353213 Exudative age-related macular degeneration, right eye, with inactive scar: Secondary | ICD-10-CM | POA: Diagnosis not present

## 2017-04-08 DIAGNOSIS — H35372 Puckering of macula, left eye: Secondary | ICD-10-CM | POA: Diagnosis not present

## 2017-04-08 DIAGNOSIS — H43813 Vitreous degeneration, bilateral: Secondary | ICD-10-CM | POA: Diagnosis not present

## 2017-04-08 DIAGNOSIS — H353122 Nonexudative age-related macular degeneration, left eye, intermediate dry stage: Secondary | ICD-10-CM | POA: Diagnosis not present

## 2017-04-30 ENCOUNTER — Ambulatory Visit
Admission: RE | Admit: 2017-04-30 | Discharge: 2017-04-30 | Disposition: A | Discharge status: Home / Self Care | Payer: Medicare Other | Source: Ambulatory Visit | Attending: Geriatric Medicine | Admitting: Geriatric Medicine

## 2017-04-30 DIAGNOSIS — Z1231 Encounter for screening mammogram for malignant neoplasm of breast: Secondary | ICD-10-CM | POA: Diagnosis not present

## 2017-04-30 DIAGNOSIS — Z139 Encounter for screening, unspecified: Secondary | ICD-10-CM

## 2017-05-11 ENCOUNTER — Other Ambulatory Visit: Payer: Self-pay | Admitting: Geriatric Medicine

## 2017-05-11 DIAGNOSIS — R928 Other abnormal and inconclusive findings on diagnostic imaging of breast: Secondary | ICD-10-CM

## 2017-05-15 ENCOUNTER — Other Ambulatory Visit: Payer: Self-pay | Admitting: Geriatric Medicine

## 2017-05-15 ENCOUNTER — Ambulatory Visit
Admission: RE | Admit: 2017-05-15 | Discharge: 2017-05-15 | Disposition: A | Discharge status: Home / Self Care | Payer: Medicare Other | Source: Ambulatory Visit | Attending: Geriatric Medicine | Admitting: Geriatric Medicine

## 2017-05-15 DIAGNOSIS — N6012 Diffuse cystic mastopathy of left breast: Secondary | ICD-10-CM | POA: Diagnosis not present

## 2017-05-15 DIAGNOSIS — N6011 Diffuse cystic mastopathy of right breast: Secondary | ICD-10-CM | POA: Diagnosis not present

## 2017-05-15 DIAGNOSIS — R928 Other abnormal and inconclusive findings on diagnostic imaging of breast: Secondary | ICD-10-CM

## 2017-05-15 DIAGNOSIS — N6310 Unspecified lump in the right breast, unspecified quadrant: Secondary | ICD-10-CM

## 2017-05-15 DIAGNOSIS — N6320 Unspecified lump in the left breast, unspecified quadrant: Secondary | ICD-10-CM

## 2017-07-28 DIAGNOSIS — H353121 Nonexudative age-related macular degeneration, left eye, early dry stage: Secondary | ICD-10-CM | POA: Diagnosis not present

## 2017-07-28 DIAGNOSIS — H353213 Exudative age-related macular degeneration, right eye, with inactive scar: Secondary | ICD-10-CM | POA: Diagnosis not present

## 2017-07-28 DIAGNOSIS — D3132 Benign neoplasm of left choroid: Secondary | ICD-10-CM | POA: Diagnosis not present

## 2017-07-28 DIAGNOSIS — H43813 Vitreous degeneration, bilateral: Secondary | ICD-10-CM | POA: Diagnosis not present

## 2017-08-04 DIAGNOSIS — G119 Hereditary ataxia, unspecified: Secondary | ICD-10-CM | POA: Diagnosis not present

## 2017-08-04 DIAGNOSIS — Z6836 Body mass index (BMI) 36.0-36.9, adult: Secondary | ICD-10-CM | POA: Diagnosis not present

## 2017-08-04 DIAGNOSIS — I10 Essential (primary) hypertension: Secondary | ICD-10-CM | POA: Diagnosis not present

## 2017-09-05 ENCOUNTER — Other Ambulatory Visit: Payer: Self-pay

## 2017-09-05 ENCOUNTER — Encounter (HOSPITAL_COMMUNITY): Payer: Self-pay | Admitting: Emergency Medicine

## 2017-09-05 ENCOUNTER — Observation Stay (HOSPITAL_COMMUNITY)
Admission: EM | Admit: 2017-09-05 | Discharge: 2017-09-06 | Disposition: A | Discharge status: Home / Self Care | Payer: Medicare Other | Attending: Internal Medicine | Admitting: Internal Medicine

## 2017-09-05 ENCOUNTER — Observation Stay (HOSPITAL_COMMUNITY): Payer: Medicare Other

## 2017-09-05 ENCOUNTER — Emergency Department (HOSPITAL_COMMUNITY): Payer: Medicare Other

## 2017-09-05 DIAGNOSIS — I10 Essential (primary) hypertension: Secondary | ICD-10-CM | POA: Diagnosis not present

## 2017-09-05 DIAGNOSIS — J209 Acute bronchitis, unspecified: Secondary | ICD-10-CM | POA: Diagnosis not present

## 2017-09-05 DIAGNOSIS — K219 Gastro-esophageal reflux disease without esophagitis: Secondary | ICD-10-CM | POA: Diagnosis not present

## 2017-09-05 DIAGNOSIS — Z880 Allergy status to penicillin: Secondary | ICD-10-CM | POA: Diagnosis not present

## 2017-09-05 DIAGNOSIS — R7989 Other specified abnormal findings of blood chemistry: Secondary | ICD-10-CM

## 2017-09-05 DIAGNOSIS — Z79899 Other long term (current) drug therapy: Secondary | ICD-10-CM | POA: Insufficient documentation

## 2017-09-05 DIAGNOSIS — R079 Chest pain, unspecified: Secondary | ICD-10-CM | POA: Diagnosis present

## 2017-09-05 DIAGNOSIS — I700 Atherosclerosis of aorta: Secondary | ICD-10-CM | POA: Insufficient documentation

## 2017-09-05 DIAGNOSIS — E785 Hyperlipidemia, unspecified: Secondary | ICD-10-CM | POA: Diagnosis not present

## 2017-09-05 DIAGNOSIS — R0789 Other chest pain: Secondary | ICD-10-CM

## 2017-09-05 DIAGNOSIS — R748 Abnormal levels of other serum enzymes: Secondary | ICD-10-CM | POA: Diagnosis not present

## 2017-09-05 DIAGNOSIS — Z87891 Personal history of nicotine dependence: Secondary | ICD-10-CM | POA: Diagnosis not present

## 2017-09-05 DIAGNOSIS — G119 Hereditary ataxia, unspecified: Secondary | ICD-10-CM | POA: Diagnosis not present

## 2017-09-05 DIAGNOSIS — R778 Other specified abnormalities of plasma proteins: Secondary | ICD-10-CM

## 2017-09-05 DIAGNOSIS — R05 Cough: Secondary | ICD-10-CM | POA: Diagnosis not present

## 2017-09-05 LAB — D-DIMER, QUANTITATIVE (NOT AT ARMC): D DIMER QUANT: 0.52 ug{FEU}/mL — AB (ref 0.00–0.50)

## 2017-09-05 LAB — MRSA PCR SCREENING: MRSA BY PCR: NEGATIVE

## 2017-09-05 LAB — CBC
HEMATOCRIT: 42.2 % (ref 36.0–46.0)
HEMOGLOBIN: 13.4 g/dL (ref 12.0–15.0)
MCH: 26.5 pg (ref 26.0–34.0)
MCHC: 31.8 g/dL (ref 30.0–36.0)
MCV: 83.4 fL (ref 78.0–100.0)
Platelets: 313 10*3/uL (ref 150–400)
RBC: 5.06 MIL/uL (ref 3.87–5.11)
RDW: 14.8 % (ref 11.5–15.5)
WBC: 12.5 10*3/uL — AB (ref 4.0–10.5)

## 2017-09-05 LAB — I-STAT TROPONIN, ED
TROPONIN I, POC: 0.1 ng/mL — AB (ref 0.00–0.08)
Troponin i, poc: 0 ng/mL (ref 0.00–0.08)

## 2017-09-05 LAB — BASIC METABOLIC PANEL
ANION GAP: 9 (ref 5–15)
BUN: 15 mg/dL (ref 6–20)
CO2: 25 mmol/L (ref 22–32)
Calcium: 9.2 mg/dL (ref 8.9–10.3)
Chloride: 108 mmol/L (ref 101–111)
Creatinine, Ser: 0.8 mg/dL (ref 0.44–1.00)
GFR calc Af Amer: >60 mL/min (ref >60)
Glucose, Bld: 106 mg/dL — ABNORMAL HIGH (ref 65–99)
POTASSIUM: 4.1 mmol/L (ref 3.5–5.1)
SODIUM: 142 mmol/L (ref 135–145)

## 2017-09-05 LAB — TROPONIN I

## 2017-09-05 MED ORDER — ONDANSETRON HCL 4 MG/2ML IJ SOLN: 4.0000 mg | Freq: Four times a day (QID) | INTRAMUSCULAR | Status: DC | PRN

## 2017-09-05 MED ORDER — MIRABEGRON ER 25 MG PO TB24
25.0000 mg | ORAL_TABLET | Freq: Every day | ORAL | Status: DC
  Administered 2017-09-06: 25 mg via ORAL
  Filled 2017-09-05 (×3): qty 1

## 2017-09-05 MED ORDER — IOPAMIDOL (ISOVUE-370) INJECTION 76%
INTRAVENOUS | Status: AC
  Filled 2017-09-05: qty 100

## 2017-09-05 MED ORDER — ENOXAPARIN SODIUM 40 MG/0.4ML ~~LOC~~ SOLN
40.0000 mg | SUBCUTANEOUS | Status: DC
  Administered 2017-09-05: 40 mg via SUBCUTANEOUS
  Filled 2017-09-05: qty 0.4

## 2017-09-05 MED ORDER — PANTOPRAZOLE SODIUM 40 MG PO TBEC
40.0000 mg | DELAYED_RELEASE_TABLET | Freq: Every day | ORAL | Status: DC
Start: 2017-09-05 — End: 2017-09-06
  Administered 2017-09-05 – 2017-09-06 (×2): 40 mg via ORAL
  Filled 2017-09-05 (×2): qty 1

## 2017-09-05 MED ORDER — GI COCKTAIL ~~LOC~~: 30.0000 mL | Freq: Four times a day (QID) | ORAL | Status: DC | PRN

## 2017-09-05 MED ORDER — NITROGLYCERIN 0.4 MG SL SUBL: 0.4000 mg | SUBLINGUAL_TABLET | SUBLINGUAL | Status: DC | PRN

## 2017-09-05 MED ORDER — ACETAMINOPHEN 325 MG PO TABS: 650.0000 mg | ORAL_TABLET | ORAL | Status: DC | PRN

## 2017-09-05 MED ORDER — GI COCKTAIL ~~LOC~~
30.0000 mL | Freq: Once | ORAL | Status: AC
  Administered 2017-09-05: 30 mL via ORAL
  Filled 2017-09-05: qty 30

## 2017-09-05 MED ORDER — VERAPAMIL HCL ER 120 MG PO TBCR: 240.0000 mg | EXTENDED_RELEASE_TABLET | Freq: Every day | ORAL | Status: DC

## 2017-09-05 MED ORDER — SIMVASTATIN 20 MG PO TABS
20.0000 mg | ORAL_TABLET | Freq: Every day | ORAL | Status: DC
  Administered 2017-09-06: 20 mg via ORAL
  Filled 2017-09-05 (×2): qty 1

## 2017-09-05 MED ORDER — MORPHINE SULFATE (PF) 2 MG/ML IV SOLN: 1.0000 mg | INTRAVENOUS | Status: DC | PRN

## 2017-09-05 MED ORDER — IOPAMIDOL (ISOVUE-370) INJECTION 76%
100.0000 mL | Freq: Once | INTRAVENOUS | Status: AC | PRN
  Administered 2017-09-05: 100 mL via INTRAVENOUS

## 2017-09-05 NOTE — H&P (Signed)
History and Physical        Hospital Admission Note Date: 09/05/2017  Patient name: Andrea Arroyo Medical record number: [id number] Date of birth: Jul 28, 1945 Age: 72 y.o. Gender: female  PCP: [name], MD    Patient coming from: Home  I have reviewed all records in the [place].    Chief Complaint:  Intermittent chest pain since yesterday  HPI: Patient is a 72 year old female with history of GERD, hyperlipidemia, hypertension, palpitations, spinocerebellar ataxia presented to ED with intermittent chest pain since yesterday.  Patient reported that she woke up with chest pain, substernal yesterday and since then she has been having intermittent episodes, no radiation, no nausea vomiting, no shortness of breath.  This morning she woke up again with chest pain, occurs even with rest and on moving.  Patient does not walk much due to history of spinocerebellar ataxia, uses cane.  No leg swelling, orthopnea PND.  No prior history of CAD or cardiac work-up.  Per patient's husband, at the time of her chest pain episode BP was 110/52 and pulse rate was 113.  At the time of examination, chest pain has resolved   ED work-up/course:  Temp 98.3, respiratory rate 16, heart rate 50-101, BP 113/53, O2 sats 96% on room Troponin 0.1 D-dimer elevated 0.52 WBCs 12.5, hemoglobin 13.4, BMET unremarkable Chest x-ray with no pneumonia EKG rate 61, normal sinus rhythm, no acute ST-T wave changes suggestive of ischemia  Review of Systems: Positives marked in 'bold' Constitutional: Denies fever, chills, diaphoresis, poor appetite and fatigue.  HEENT: Denies photophobia, eye pain, redness, hearing loss, ear pain, congestion, sore throat, rhinorrhea, sneezing, mouth sores, trouble swallowing, neck pain, neck stiffness and tinnitus.   Respiratory: No fevers chills, wheezing or  coughing Cardiovascular: Please see HPI Gastrointestinal: Denies nausea, vomiting, abdominal pain, diarrhea, constipation, blood in stool and abdominal distention.  Genitourinary: Denies dysuria, urgency, frequency, hematuria, flank pain and difficulty urinating.  Musculoskeletal: Denies myalgias, back pain, joint swelling, arthralgias and gait problem.  Skin: Denies pallor, rash and wound.  Neurological: Denies dizziness, seizures, syncope, weakness, light-headedness, numbness and headaches.  Hematological: Denies adenopathy. Easy bruising, personal or family bleeding history  Psychiatric/Behavioral: Denies suicidal ideation, mood changes, confusion, nervousness, sleep disturbance and agitation  Past Medical History: Past Medical History:  Diagnosis Date  . GERD (gastroesophageal reflux disease)   . Hyperlipidemia   . Hypertension   . Rapid heart rate   . SCA-6 (spinocerebellar ataxia type 6) ([place])     Past Surgical History:  Procedure Laterality Date  . CATARACT EXTRACTION, BILATERAL    . TOTAL HIP ARTHROPLASTY Right     Medications: Prior to Admission medications   Medication Sig Start Date End Date Taking? Authorizing Provider  CALCIUM PO Take by mouth daily.    [provider]  Cholecalciferol (VITAMIN D PO) Take 5,000 Units by mouth daily.    [provider]  losartan (COZAAR) 50 MG tablet Take 50 mg by mouth daily. 10/30/16   [provider]  multivitamin-lutein (OCUVITE-LUTEIN) CAPS capsule Take 1 capsule by mouth daily.    [provider]  MYRBETRIQ 25 MG TB24 tablet Take 25 mg by mouth  daily. 09/18/16   [provider]  omeprazole (PRILOSEC) 20 MG capsule Take 20 mg by mouth daily. 10/30/16   [provider]  simvastatin (ZOCOR) 20 MG tablet Take 20 mg by mouth daily. 10/30/16   [provider]  triamcinolone ointment (KENALOG) 0.1 % Apply 1 application topically 2 (two) times daily.    [provider]   verapamil (CALAN-SR) 240 MG CR tablet Take 240 mg by mouth at bedtime.    [provider]    Allergies:   Allergies  Allergen Reactions  . Penicillins Rash    Social History:  reports that she quit smoking about 10 years ago. She has never used smokeless tobacco. She reports that she does not drink alcohol or use drugs.  Family History: No family history on file.  Physical Exam: Blood pressure (!) 113/53, pulse (!) 58, temperature 98.3 F (36.8 C), temperature source Oral, resp. rate 12, SpO2 96 %. General: Alert, awake, oriented x3, in no acute distress, aphasia (chronic). Eyes: pink conjunctiva,anicteric sclera, pupils equal and reactive to light and accomodation, HEENT: normocephalic, atraumatic, oropharynx clear Neck: supple, no masses or lymphadenopathy, no goiter, no bruits, no JVD CVS: Regular rate and rhythm, without murmurs, rubs or gallops. No lower extremity edema Resp : Clear to auscultation bilaterally, no wheezing, rales or rhonchi. GI : Soft, nontender, nondistended, positive bowel sounds, no masses. No hepatomegaly. No hernia.  Musculoskeletal: No clubbing or cyanosis, positive pedal pulses. No contracture. ROM intact  Neuro: Grossly intact, no focal neurological deficits, strength 5/5 upper and lower extremities bilaterally Psych: alert and oriented x 3, normal mood and affect Skin: no rashes or lesions, warm and dry   LABS on Admission: I have personally reviewed all the labs and imagings below    Basic Metabolic Panel: Recent Labs  Lab 09/05/17 1205  NA 142  K 4.1  CL 108  CO2 25  GLUCOSE 106*  BUN 15  CREATININE 0.80  CALCIUM 9.2   Liver Function Tests: No results for input(s): AST, ALT, ALKPHOS, BILITOT, PROT, ALBUMIN in the last 168 hours. No results for input(s): LIPASE, AMYLASE in the last 168 hours. No results for input(s): AMMONIA in the last 168 hours. CBC: Recent Labs  Lab 09/05/17 1205  WBC 12.5*  HGB 13.4  HCT 42.2   MCV 83.4  PLT 313   Cardiac Enzymes: No results for input(s): CKTOTAL, CKMB, CKMBINDEX, TROPONINI in the last 168 hours. BNP: Invalid input(s): POCBNP CBG: No results for input(s): GLUCAP in the last 168 hours.  Radiological Exams on Admission:  Dg Chest 2 View  Result Date: 09/05/2017 CLINICAL DATA:  Chest pain and cough EXAM: CHEST - 2 VIEW COMPARISON:  None. FINDINGS: Lungs are clear. Heart size and pulmonary vascularity are normal. No adenopathy. There is aortic atherosclerosis. No bone lesions. IMPRESSION: Aortic atherosclerosis.  No edema or consolidation. Aortic Atherosclerosis (ICD10-I70.0). Electronically Signed   By: Lowella Grip III M.D.   On: 09/05/2017 15:02      EKG: Independently reviewed.EKG rate 61, normal sinus rhythm, no acute ST-T wave changes suggestive of ischemia   Assessment/Plan Principal Problem: Atypical chest pain: Risk factors include hypertension, hyperlipidemia, rule out PE, sedentary lifestyle -Troponin times one 0.1, d-dimer elevated 0.52, obtain serial cardiac enzymes -Obtain CT angiogram of the chest to rule out PE -Place on aspirin 81 mg daily, obtain 2D echocardiogram for further work-up -If troponins trending up, will consult cardiology -BP somewhat soft, will hold off on Benicar.  Continue with verapamil  due to history of palpitations, hold for heart rate less than 50  Active Problems:   Essential hypertension -BP stable but soft, hold off on Benicar, continue verapamil due to history of palpitations    Hyperlipidemia -Continue simvastatin    GERD (gastroesophageal reflux disease) -Continue PPI     Cerebellar ataxia ([place]) -Per patient she has inherited spinocerebellar ataxia, ambulates with a cane, has chronic speech deficit  DVT prophylaxis: Lovenox  CODE STATUS: Full code, discussed with patient  Consults called: None  Family Communication: Admission, patients condition and plan of care including tests being ordered  have been discussed with the patient and husband who indicates understanding and agree with the plan and Code Status  Admission status: Observation telemetry  Disposition plan: Further plan will depend as patient's clinical course evolves and further radiologic and laboratory data become available.    At the time of admission, it appears that the appropriate admission status for this patient is INPATIENT . This is judged to be reasonable and necessary in order to provide the required intensity of service to ensure the patient's safety given the presenting symptoms, physical exam findings, and initial radiographic and laboratory data in the context of their chronic comorbidities.  The medical decision making on this patient was of high complexity and the patient is at high risk for clinical deterioration, therefore this is a level 3 visit.   Time Spent on Admission: 60 minutes   Nuno Brubacher M.D. [facility] 09/05/2017, 5:41 PM Pager: [phone]  If 7PM-7AM, please contact night-coverage [email or web address] Password [email or web address]

## 2017-09-05 NOTE — ED Provider Notes (Signed)
[facility]ENCY DEPARTMENT Provider Note   CSN: [id number] Arrival date & time: 09/05/17  1143     History   Chief Complaint Chief Complaint  Patient presents with  . Chest Pain    HPI Andrea Arroyo is a 72 y.o. female.  HPI Pt has been having some tightness in her chest since yesterday.  It comes and goes.  It lasts a few minutes at a time.  It occurs sometimes when she is at rest.  She tried moving around but it persisted.  She still has some tightness now.  She has been coughing.  No shortness of breath.  No leg swelling.  No history of heart attack or blood clot.   Pt's husband took her pulse this am and it was 113.    Past Medical History:  Diagnosis Date  . GERD (gastroesophageal reflux disease)   . Hyperlipidemia   . Hypertension   . Rapid heart rate   . SCA-6 (spinocerebellar ataxia type 6) ([place])     There are no active problems to display for this patient.   Past Surgical History:  Procedure Laterality Date  . CATARACT EXTRACTION, BILATERAL    . TOTAL HIP ARTHROPLASTY Right      OB History   None      Home Medications    Prior to Admission medications   Medication Sig Start Date End Date Taking? Authorizing Provider  CALCIUM PO Take by mouth daily.    [provider]  Cholecalciferol (VITAMIN D PO) Take 5,000 Units by mouth daily.    [provider]  losartan (COZAAR) 50 MG tablet Take 50 mg by mouth daily. 10/30/16   [provider]  multivitamin-lutein (OCUVITE-LUTEIN) CAPS capsule Take 1 capsule by mouth daily.    [provider]  MYRBETRIQ 25 MG TB24 tablet Take 25 mg by mouth daily. 09/18/16   [provider]  omeprazole (PRILOSEC) 20 MG capsule Take 20 mg by mouth daily. 10/30/16   [provider]  simvastatin (ZOCOR) 20 MG tablet Take 20 mg by mouth daily. 10/30/16   [provider]  triamcinolone ointment (KENALOG) 0.1 % Apply 1 application topically 2 (two)  times daily.    [provider]  verapamil (CALAN-SR) 240 MG CR tablet Take 240 mg by mouth at bedtime.    [provider]    Family History No family history on file.  Social History Social History   Tobacco Use  . Smoking status: Former Smoker    Last attempt to quit: 11/04/2006    Years since quitting: 10.8  . Smokeless tobacco: Never Used  Substance Use Topics  . Alcohol use: No  . Drug use: No     Allergies   Penicillins   Review of Systems Review of Systems  All other systems reviewed and are negative.    Physical Exam Updated Vital Signs BP (!) 113/53   Pulse (!) 58   Temp 98.3 F (36.8 C) (Oral)   Resp 12   SpO2 96%   Physical Exam  Constitutional: She appears well-developed and well-nourished. No distress.  HENT:  Head: Normocephalic and atraumatic.  Right Ear: External ear normal.  Left Ear: External ear normal.  Eyes: Conjunctivae are normal. Right eye exhibits no discharge. Left eye exhibits no discharge. No scleral icterus.  Neck: Neck supple. No tracheal deviation present.  Cardiovascular: Normal rate, regular rhythm and intact distal pulses.  Pulmonary/Chest: Effort normal and breath sounds normal. No stridor.  No respiratory distress. She has no wheezes. She has no rales.  Abdominal: Soft. Bowel sounds are normal. She exhibits no distension. There is no tenderness. There is no rebound and no guarding.  Musculoskeletal: She exhibits no edema or tenderness.  Neurological: She is alert. She has normal strength. No cranial nerve deficit (no facial droop, extraocular movements intact, no slurred speech) or sensory deficit. She exhibits normal muscle tone. She displays no seizure activity. Coordination normal.  Skin: Skin is warm and dry. No rash noted.  Psychiatric: She has a normal mood and affect.  Nursing note and vitals reviewed.    ED Treatments / Results  Labs (all labs ordered are listed, but only abnormal results are  displayed) Labs Reviewed  BASIC METABOLIC PANEL - Abnormal; Notable for the following components:      Result Value   Glucose, Bld 106 (*)    All other components within normal limits  CBC - Abnormal; Notable for the following components:   WBC 12.5 (*)    All other components within normal limits  D-DIMER, QUANTITATIVE (NOT AT [facility]) - Abnormal; Notable for the following components:   D-Dimer, Quant 0.52 (*)    All other components within normal limits  I-STAT TROPONIN, ED - Abnormal; Notable for the following components:   Troponin i, poc 0.10 (*)    All other components within normal limits  I-STAT TROPONIN, ED    EKG EKG Interpretation  Date/Time:  Saturday Sep 05 2017 11:51:49 EDT Ventricular Rate:  61 PR Interval:  172 QRS Duration: 90 QT Interval:  446 QTC Calculation: 448 R Axis:   34 Text Interpretation:  Normal sinus rhythm Low voltage QRS Nonspecific T wave abnormality Abnormal ECG No old tracing to compare Confirmed by Dorie Rank [phone]) on 09/05/2017 1:08:03 PM Also confirmed by Dorie Rank [phone]), editor Philomena Doheny [phone])  on 09/05/2017 1:28:14 PM   Radiology Dg Chest 2 View  Result Date: 09/05/2017 CLINICAL DATA:  Chest pain and cough EXAM: CHEST - 2 VIEW COMPARISON:  None. FINDINGS: Lungs are clear. Heart size and pulmonary vascularity are normal. No adenopathy. There is aortic atherosclerosis. No bone lesions. IMPRESSION: Aortic atherosclerosis.  No edema or consolidation. Aortic Atherosclerosis (ICD10-I70.0). Electronically Signed   By: Lowella Grip III M.D.   On: 09/05/2017 15:02    Procedures Procedures (including critical care time)  Medications Ordered in ED Medications  gi cocktail (Maalox,Lidocaine,Donnatal) (30 mLs Oral Given 09/05/17 1522)     Initial Impression / Assessment and Plan / ED Course  I have reviewed the triage vital signs and the nursing notes.  Pertinent labs & imaging results that were available during my care of the  patient were reviewed by me and considered in my medical decision making (see chart for details).  Clinical Course as of Sep 05 1652  Sat Sep 05, 2017  1650 2nd troponin is slightly elevated at 0.10.  D dimer is slightly elevated but acceptable range when adjusting for age.     [JK]    Clinical Course User Index [JK] Dorie Rank, MD    Patient presented the emergency room for evaluation of chest discomfort.  She does not have a history of heart disease but she does have a history of hypertension hyperlipidemia.  Patient's initial troponin is normal but her second trop is slightly elevated.  Considering her age and risk factors I will consult the medical service for admission and further work-up.  Final Clinical Impressions(s) / ED Diagnoses   Final  diagnoses:  Chest pain, unspecified type  Elevated troponin      Dorie Rank, MD 09/05/17 1655

## 2017-09-05 NOTE — ED Notes (Signed)
Dr.J.Knapp made aware of I-stat troponin level. Ed-lab

## 2017-09-05 NOTE — ED Notes (Signed)
ORDERED HH TRAY

## 2017-09-05 NOTE — ED Notes (Signed)
Attempted PIV x 2. No success. IV team consult placed.

## 2017-09-05 NOTE — ED Notes (Signed)
Delay in pt transfer. Attempted to call report and get pt upstairs. Floor unable to receive pt until after 1900. ED charge RN made aware. Secretary ordering pt dinner tray at this time.

## 2017-09-05 NOTE — ED Notes (Signed)
Patient transported to CT 

## 2017-09-05 NOTE — ED Triage Notes (Signed)
Patient here after her husband checked her pulse and she says he told her it was fast. Denies chest pain, denies shortness of breath. Alert and oriented and in no apparent distress at this time. Speech is slightly slurred, this is baseline per patient.

## 2017-09-06 ENCOUNTER — Encounter (HOSPITAL_COMMUNITY): Payer: Self-pay | Admitting: *Deleted

## 2017-09-06 ENCOUNTER — Other Ambulatory Visit: Payer: Self-pay

## 2017-09-06 ENCOUNTER — Observation Stay (HOSPITAL_BASED_OUTPATIENT_CLINIC_OR_DEPARTMENT_OTHER): Payer: Medicare Other

## 2017-09-06 DIAGNOSIS — R748 Abnormal levels of other serum enzymes: Secondary | ICD-10-CM | POA: Diagnosis not present

## 2017-09-06 DIAGNOSIS — J209 Acute bronchitis, unspecified: Secondary | ICD-10-CM | POA: Diagnosis not present

## 2017-09-06 DIAGNOSIS — R079 Chest pain, unspecified: Secondary | ICD-10-CM | POA: Diagnosis not present

## 2017-09-06 DIAGNOSIS — G119 Hereditary ataxia, unspecified: Secondary | ICD-10-CM | POA: Diagnosis not present

## 2017-09-06 LAB — TROPONIN I: Troponin I: <0.03 ng/mL (ref <0.03)

## 2017-09-06 LAB — ECHOCARDIOGRAM COMPLETE
Height: 64 in
Weight: 3198.4 oz

## 2017-09-06 MED ORDER — DOXYCYCLINE HYCLATE 100 MG PO TABS: 100.0000 mg | ORAL_TABLET | Freq: Two times a day (BID) | ORAL | 0 refills | Status: AC

## 2017-09-06 MED ORDER — BENZONATATE 100 MG PO CAPS: 100.0000 mg | ORAL_CAPSULE | Freq: Three times a day (TID) | ORAL | Status: DC | PRN

## 2017-09-06 MED ORDER — NITROGLYCERIN 0.4 MG SL SUBL: 0.4000 mg | SUBLINGUAL_TABLET | SUBLINGUAL | 12 refills | Status: DC | PRN

## 2017-09-06 MED ORDER — BENZONATATE 100 MG PO CAPS: 100.0000 mg | ORAL_CAPSULE | Freq: Three times a day (TID) | ORAL | 0 refills | Status: DC | PRN

## 2017-09-06 MED ORDER — DOXYCYCLINE HYCLATE 100 MG PO TABS
100.0000 mg | ORAL_TABLET | Freq: Two times a day (BID) | ORAL | Status: DC
  Administered 2017-09-06: 100 mg via ORAL
  Filled 2017-09-06: qty 1

## 2017-09-06 MED ORDER — OLMESARTAN MEDOXOMIL 20 MG PO TABS: 20.0000 mg | ORAL_TABLET | Freq: Every day | ORAL | Status: DC

## 2017-09-06 NOTE — Discharge Summary (Signed)
Physician Discharge Summary   Patient ID: Andrea Arroyo MRN: [id number] DOB/AGE: [birth year] 72 y.o.  Admit date: 09/05/2017 Discharge date: 09/06/2017  Primary Care Physician:  [name], MD   Recommendations for Outpatient Follow-up:  1. Follow up with PCP in 1-2 weeks 2. Benicar currently placed on hold until follow-up with PCP, please restart if BP allows  Home Health: None  Equipment/Devices: none  Discharge Condition: stable CODE STATUS: FULL   Diet recommendation: heart healthy diet   Discharge Diagnoses:   Mild acute bronchitis . Atypical chest pain . Essential hypertension . Hyperlipidemia . GERD (gastroesophageal reflux disease)   Consults: None    Allergies:   Allergies  Allergen Reactions  . Penicillins Rash    Childhood reaction Has patient had a PCN reaction causing immediate rash, facial/tongue/throat swelling, SOB or lightheadedness with hypotension: Yes Has patient had a PCN reaction causing severe rash involving mucus membranes or skin necrosis: No Has patient had a PCN reaction that required hospitalization: No Has patient had a PCN reaction occurring within the last 10 years: No If all of the above answers are "NO", then may proceed with Cephalosporin use.     DISCHARGE MEDICATIONS: Allergies as of 09/06/2017      Reactions   Penicillins Rash   Childhood reaction Has patient had a PCN reaction causing immediate rash, facial/tongue/throat swelling, SOB or lightheadedness with hypotension: Yes Has patient had a PCN reaction causing severe rash involving mucus membranes or skin necrosis: No Has patient had a PCN reaction that required hospitalization: No Has patient had a PCN reaction occurring within the last 10 years: No If all of the above answers are "NO", then may proceed with Cephalosporin use.      Medication List    TAKE these medications   benzonatate 100 MG capsule Commonly known as:  TESSALON Take 1 capsule (100 mg total)  by mouth 3 (three) times daily as needed for cough.   CALCIUM 600+D 600-400 MG-UNIT tablet Generic drug:  Calcium Carbonate-Vitamin D Take 1 tablet by mouth 2 (two) times daily.   doxycycline 100 MG tablet Commonly known as:  VIBRA-TABS Take 1 tablet (100 mg total) by mouth 2 (two) times daily for 7 days.   hydrocortisone cream 1 % Apply 1 application topically daily as needed (ear itching).   multivitamin-lutein Caps capsule Take 1 capsule by mouth daily.   ICAPS AREDS 2 Caps Take 1 capsule by mouth 2 (two) times daily.   MYRBETRIQ 25 MG Tb24 tablet Generic drug:  mirabegron ER Take 25 mg by mouth daily.   nitroGLYCERIN 0.4 MG SL tablet Commonly known as:  NITROSTAT Place 1 tablet (0.4 mg total) under the tongue every 5 (five) minutes as needed for chest pain.   olmesartan 20 MG tablet Commonly known as:  BENICAR Take 1 tablet (20 mg total) by mouth daily. HOLD until follow-up with your doctor What changed:  additional instructions   omeprazole 20 MG capsule Commonly known as:  PRILOSEC Take 20 mg by mouth daily.   REFRESH OP Place 1 drop into the right eye at bedtime.   simvastatin 20 MG tablet Commonly known as:  ZOCOR Take 20 mg by mouth at bedtime.   verapamil 240 MG CR tablet Commonly known as:  CALAN-SR Take 240 mg by mouth at bedtime.   Vitamin D-3 5000 units Tabs Take 5,000 Units by mouth daily.        Brief H and P: For complete details please refer to admission H  and P, but in brief Patient is a 72 year old female with history of GERD, hyperlipidemia, hypertension, palpitations, spinocerebellar ataxia presented to ED with intermittent chest pain since yesterday.  Patient reported that she woke up with chest pain, substernal yesterday and since then she has been having intermittent episodes, no radiation, no nausea vomiting, no shortness of breath.  This morning she woke up again with chest pain, occurs even with rest and on moving.  Patient does not  walk much due to history of spinocerebellar ataxia, uses cane.  No leg swelling, orthopnea PND.  No prior history of CAD or cardiac work-up.  Per patient's husband, at the time of her chest pain episode BP was 110/52 and pulse rate was 113.  At the time of examination, chest pain has resolved     Hospital Course:  Atypical chest pain:  Possibly due to acute bronchitis  -Patient was admitted due to risk factors including hypertension, hyperlipidemia, rule out PE, sedentary lifestyle -Troponin times one 0.1, d-dimer elevated 0.52, repeat serial cardiac enzymes x3 were negative for acute ACS -CT angiogram negative for PE however showed groundglass upper lobe opacities, most consistent with infection or inflammation.  Placed on doxycycline for 1 week, Tessalon Perles as needed for cough -BP somewhat soft, will hold off on Benicar.  Continue with verapamil due to history of palpitations, hold for heart rate less than 50 -2D echo showed EF of 55 to 58%, grade 1 diastolic dysfunction.  Active Problems:   Essential hypertension -BP stable but soft, hold off on Benicar, continue verapamil due to history of palpitations    Hyperlipidemia -Continue simvastatin    GERD (gastroesophageal reflux disease) -Continue PPI     Cerebellar ataxia ([place]) -Per patient she has inherited spinocerebellar ataxia, ambulates with a cane, has chronic speech deficit    Day of Discharge S: No chest pain, palpitations or shortness of breath, no acute events overnight.  Eager to go home  BP (!) 125/51 (BP Location: Right Arm)   Pulse 61   Temp 98 F (36.7 C) (Oral)   Resp 16   Ht 5\' 4"  (1.626 m)   Wt 90.7 kg (199 lb 14.4 oz)   SpO2 100%   BMI 34.31 kg/m   Physical Exam: General: Alert and awake oriented x3 not in any acute distress.  Speech at baseline HEENT: anicteric sclera, pupils reactive to light and accommodation CVS: S1-S2 clear no murmur rubs or gallops Chest: clear to auscultation  bilaterally, no wheezing rales or rhonchi Abdomen: soft nontender, nondistended, normal bowel sounds Extremities: no cyanosis, clubbing or edema noted bilaterally Neuro no new deficit ficits   The results of significant diagnostics from this hospitalization (including imaging, microbiology, ancillary and laboratory) are listed below for reference.      Procedures/Studies: 2D echo Study Conclusions  - Left ventricle: The cavity size was normal. Wall thickness was   increased in a pattern of mild LVH. Systolic function was normal.   The estimated ejection fraction was in the range of 55% to 60%.   Wall motion was normal; there were no regional wall motion   abnormalities. Doppler parameters are consistent with abnormal   left ventricular relaxation (grade 1 diastolic dysfunction). The   E/e&' ratio is between 8-15, suggesting indeterminate LV filling   pressure. - Left atrium: The atrium was mildly dilated. - Inferior vena cava: The vessel was normal in size. The   respirophasic diameter changes were in the normal range (>= 50%),   consistent with normal  central venous pressure.  Impressions:  - LVEF 55-60%, mild LVH, normal wall motion, grade 1 DD,   indeterminate LV filling pressure, mild LAE, normal IVC.   Dg Chest 2 View  Result Date: 09/05/2017 CLINICAL DATA:  Chest pain and cough EXAM: CHEST - 2 VIEW COMPARISON:  None. FINDINGS: Lungs are clear. Heart size and pulmonary vascularity are normal. No adenopathy. There is aortic atherosclerosis. No bone lesions. IMPRESSION: Aortic atherosclerosis.  No edema or consolidation. Aortic Atherosclerosis (ICD10-I70.0). Electronically Signed   By: Lowella Grip III M.D.   On: 09/05/2017 15:02   Ct Angio Chest Pe W Or Wo Contrast  Result Date: 09/05/2017 CLINICAL DATA:  Centralized chest pressure. No reported shortness of breath. EXAM: CT ANGIOGRAPHY CHEST WITH CONTRAST TECHNIQUE: Multidetector CT imaging of the chest was  performed using the standard protocol during bolus administration of intravenous contrast. Multiplanar CT image reconstructions and MIPs were obtained to evaluate the vascular anatomy. CONTRAST:  55mL ISOVUE-370 IOPAMIDOL (ISOVUE-370) INJECTION 76% COMPARISON:  Current chest radiograph. FINDINGS: Cardiovascular: Satisfactory opacification of the pulmonary arteries to the segmental level. No evidence of pulmonary embolism. Normal heart size. No pericardial effusion. Mild left coronary artery calcifications. Great vessels are normal in caliber. Mild aortic atherosclerosis. Mediastinum/Nodes: Small poorly defined hypoattenuating thyroid nodules suggested. No neck base or axillary masses or pathologically enlarged lymph nodes. No mediastinal or hilar masses or adenopathy. Trachea is patent. Esophagus is unremarkable. Lungs/Pleura: There are hazy areas of peribronchovascular ground-glass opacity in the anterior upper lobes. Remainder of the lungs is clear. No pleural effusion or pneumothorax. Upper Abdomen: Nonspecific 17 mm low-density lesion in the anterior left liver lobe possibly a cyst or hemangioma, incompletely characterized on this exam. No acute findings in the upper abdomen. Musculoskeletal: No fracture or acute finding. No osteoblastic or osteolytic lesions. Review of the MIP images confirms the above findings. IMPRESSION: 1. No evidence of a pulmonary embolism. 2. Ground-glass upper lobe opacities. Findings appear most consistent with infection or inflammation. 3. Aortic atherosclerosis. Mild left coronary artery calcifications. Aortic Atherosclerosis (ICD10-I70.0). Electronically Signed   By: [name] M.D.   On: 09/05/2017 19:38       LAB RESULTS: Basic Metabolic Panel: Recent Labs  Lab 09/05/17 1205  NA 142  K 4.1  CL 108  CO2 25  GLUCOSE 106*  BUN 15  CREATININE 0.80  CALCIUM 9.2   Liver Function Tests: No results for input(s): AST, ALT, ALKPHOS, BILITOT, PROT, ALBUMIN in the last  168 hours. No results for input(s): LIPASE, AMYLASE in the last 168 hours. No results for input(s): AMMONIA in the last 168 hours. CBC: Recent Labs  Lab 09/05/17 1205  WBC 12.5*  HGB 13.4  HCT 42.2  MCV 83.4  PLT 313   Cardiac Enzymes: Recent Labs  Lab 09/05/17 1836 09/06/17 0212  TROPONINI <0.03 <0.03   BNP: Invalid input(s): POCBNP CBG: No results for input(s): GLUCAP in the last 168 hours.    Disposition and Follow-up: Discharge Instructions    Diet - low sodium heart healthy   Complete by:  As directed    Increase activity slowly   Complete by:  As directed        DISPOSITION: Home   DISCHARGE FOLLOW-UP Follow-up Information    Stoneking, Hal, MD. Schedule an appointment as soon as possible for a visit in 10 day(s).   Specialty:  Internal Medicine Contact information: [address] [place] [address] [phone]  Time coordinating discharge:  25 minutes  Signed:   Estill Cotta M.D. [facility] 09/06/2017, 1:11 PM Pager: [phone]

## 2017-09-06 NOTE — Progress Notes (Signed)
  Echocardiogram 2D Echocardiogram has been performed.  Merrie Roof F 09/06/2017, 10:07 AM

## 2017-09-09 DIAGNOSIS — E669 Obesity, unspecified: Secondary | ICD-10-CM | POA: Diagnosis not present

## 2017-09-09 DIAGNOSIS — J209 Acute bronchitis, unspecified: Secondary | ICD-10-CM | POA: Diagnosis not present

## 2017-09-09 DIAGNOSIS — I10 Essential (primary) hypertension: Secondary | ICD-10-CM | POA: Diagnosis not present

## 2017-11-02 NOTE — Progress Notes (Signed)
Andrea Arroyo was seen today in the movement disorders clinic for neurologic consultation at the request of [name], MD.  The consultation is for the evaluation of cerebellar ataxia.  She has apparently been evaluated for the same previously in [name] but I don't have any of those records.  Pt states that the first sx came on when she was about 72 years old.  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  Over a few years, sx's progressed to involve speech and significant walking trouble.  She has not been able to walk unassisted since about 2003.  She fell and fx the right hip about 7 years ago and husband states that she was able to get back to her baseline thereafter.  She lives independently with her husband at [place].  She has no trouble swallowing but does state that "I have a lot of coughing with liquids or with watermelon."  Husband says that she does well with taking her medication.  She has no diplopia.  She does have macular degeneration.    In order to get the bathroom, she ambulates with her husband with a walker (rollator).  She also has a power WC which she uses at [employer].  She is able to steer straight with the power wheelchair, but is having some trouble when she has to steer around objects or people are with her, primarily because of the ataxia when she has to grab for control.  She has no cramping of the limbs.  No paresthesias.  Pool 2 days a week for exercise and is going to be starting PT.  Has had ST in the past and she doesn't think that it was helpful.    Reports that her mother had similar sx's but they called it MS.  Her maternal uncle, 2 maternal aunts and her brother all have same sx's.  She has 3 children and none of them have been affected (at this point).  01/05/17 update:  Here for power WC eval.  This patient is accompanied in the office by her spouse who supplements the history.  Patient presents for a mobility  evaluation.  I have reviewed and agree with PT evaluation.  Pt needs power WC as she has difficulty getting to bathroom on time.  Her battery on her own chair has warn out after 6 years. Pt has the mental capability to use the power WC and has demonstrated that with the PT.  she cannot use manual WC because of endurance abilities to propel and poor coordination of UE's given her SCA.  .she could not mount a scooter/transfer onto safely, hence making it inappropriate for this patient.   Rollator not appropriate as multiple falls with this and traditional walker.   Pt has the mental capability to use the power WC and has demonstrated that.  she cannot use manual WC because of endurance abilities to propel and poor coordination of UE's. she could not mount a scooter/transfer onto safely, hence making it inappropriate for this patient.   Rollator not appropriate as multiple falls with this and traditional walker.   11/03/17 update: Patient is seen today in follow-up for spinocerebellar ataxia.  She is accompanied by her husband who supplements the history.  Patient received her power wheelchair since last visit and reports that it has been a very good thing.  She uses it around the apartment complex.  She does use her walker within the  apartment.  She had 3 falls.  On 2 occasions, she took her husband down with her.   Records have been reviewed since last visit.  She was hospitalized just for 1 night on Sep 05, 2017 for chest pain.  She was diagnosed with mild acute bronchitis.  She is swallowing okay.  She denies diplopia.  She does have blurry vision with macular degeneration (sees Dr. Ricki Miller).  Noting some tremor in the left leg.  Husband reports mood issues but "she ain't got intolerable yet."   Having constipation  ALLERGIES:   Allergies  Allergen Reactions  . Penicillins Rash    Childhood reaction Has patient had a PCN reaction causing immediate rash, facial/tongue/throat swelling, SOB or  lightheadedness with hypotension: Yes Has patient had a PCN reaction causing severe rash involving mucus membranes or skin necrosis: No Has patient had a PCN reaction that required hospitalization: No Has patient had a PCN reaction occurring within the last 10 years: No If all of the above answers are "NO", then may proceed with Cephalosporin use.    CURRENT MEDICATIONS:  Outpatient Encounter Medications as of 11/03/2017  Medication Sig  . Ascorbic Acid (VITAMIN C) 100 MG tablet Take 100 mg by mouth daily.  Marland Kitchen aspirin EC 81 MG tablet Take 81 mg by mouth daily.  . Calcium Carbonate-Vitamin D (CALCIUM 600+D) 600-400 MG-UNIT tablet Take 1 tablet by mouth 2 (two) times daily.  . Cholecalciferol (VITAMIN D-3) 5000 units TABS Take 5,000 Units by mouth daily.  . hydrocortisone cream 1 % Apply 1 application topically daily as needed (ear itching).  . mirabegron ER (MYRBETRIQ) 25 MG TB24 tablet Take 25 mg by mouth daily.  . Multiple Vitamins-Minerals (ICAPS AREDS 2) CAPS Take 1 capsule by mouth 2 (two) times daily.  . multivitamin-lutein (OCUVITE-LUTEIN) CAPS capsule Take 1 capsule by mouth daily.  Marland Kitchen omeprazole (PRILOSEC) 20 MG capsule Take 20 mg by mouth daily.  . Polyvinyl Alcohol-Povidone (REFRESH OP) Place 1 drop into the right eye at bedtime.  . simvastatin (ZOCOR) 20 MG tablet Take 20 mg by mouth at bedtime.   . sodium chloride (OCEAN) 0.65 % SOLN nasal spray Place 1 spray into both nostrils as needed for congestion.  . triamcinolone (KENALOG) 0.025 % cream Apply 1 application topically 2 (two) times daily.  . verapamil (CALAN-SR) 240 MG CR tablet Take 240 mg by mouth at bedtime.  . nitroGLYCERIN (NITROSTAT) 0.4 MG SL tablet Place 1 tablet (0.4 mg total) under the tongue every 5 (five) minutes as needed for chest pain. (Patient not taking: Reported on 11/03/2017)  . [DISCONTINUED] benzonatate (TESSALON) 100 MG capsule Take 1 capsule (100 mg total) by mouth 3 (three) times daily as needed for  cough.  . [DISCONTINUED] olmesartan (BENICAR) 20 MG tablet Take 1 tablet (20 mg total) by mouth daily. HOLD until follow-up with your doctor   No facility-administered encounter medications on file as of 11/03/2017.     PAST MEDICAL HISTORY:   Past Medical History:  Diagnosis Date  . GERD (gastroesophageal reflux disease)   . Hyperlipidemia   . Hypertension   . Rapid heart rate   . SCA-6 (spinocerebellar ataxia type 6) ([place])     PAST SURGICAL HISTORY:   Past Surgical History:  Procedure Laterality Date  . CATARACT EXTRACTION, BILATERAL    . TOTAL HIP ARTHROPLASTY Right     SOCIAL HISTORY:   Social History   Socioeconomic History  . Marital status: Married    Spouse name: Not on file  .  Number of children: Not on file  . Years of education: Not on file  . Highest education level: Not on file  Occupational History  . 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 Financial resource strain: Not on file  . Food insecurity:    Worry: Not on file    Inability: Not on file  . Transportation needs:    Medical: Not on file    Non-medical: Not on file  Tobacco Use  . Smoking status: Former Smoker    Last attempt to quit: 11/04/2006    Years since quitting: 11.0  . Smokeless tobacco: Never Used  Substance and Sexual Activity  . Alcohol use: No  . Drug use: No  . Sexual activity: Not on file  Lifestyle  . Physical activity:    Days per week: Not on file    Minutes per session: Not on file  . Stress: Not on file  Relationships  . Social connections:    Talks on phone: Not on file    Gets together: Not on file    Attends religious service: Not on file    Active member of club or organization: Not on file    Attends meetings of clubs or organizations: Not on file    Relationship status: Not on file  . Intimate partner violence:    Fear of current or ex partner: Not on file    Emotionally abused: Not on file    Physically abused: Not on file     Forced sexual activity: Not on file  Other Topics Concern  . Not on file  Social History Narrative  . Not on file    FAMILY HISTORY:   Family Status  Relation Name Status  . Mother  Deceased       SCA 6  . Father  Deceased  . Sister  Alive  . Brother  Alive       SCA 6  . Child x3 Alive    ROS:  A complete 10 system review of systems was obtained and was unremarkable apart from what is mentioned above.  PHYSICAL EXAMINATION:    VITALS:   Vitals:   11/03/17 1437  BP: 112/62  Pulse: 60  SpO2: 97%    GEN:  The patient appears stated age and is in NAD. HEENT:  Normocephalic, atraumatic.  The mucous membranes are moist. The superficial temporal arteries are without ropiness or tenderness. CV:  RRR Lungs:  CTAB Neck/HEME:  There are no carotid bruits bilaterally.  Neurological examination:  Orientation: The patient is alert and oriented x3. Fund of knowledge is appropriate.  Recent and remote memory are intact.  Attention and concentration are normal.    Able to name objects and repeat phrases. Cranial nerves: There is good facial symmetry. Pupils are equal round and reactive to light bilaterally. Fundoscopic exam reveals clear margins bilaterally. Extraocular muscles are intact.  There is overshoot dysmetria.  There is horizontal nystagmus.  The visual fields are full to confrontational testing. The speech is fluent and pseudobulbar in quality. Soft palate rises symmetrically and there is no tongue deviation. Hearing is intact to conversational tone. Sensation: Sensation is intact to light and pinprick throughout (facial, trunk, extremities). Vibration is intact at the bilateral big toe. There is no extinction with double simultaneous stimulation. There is no sensory dermatomal level identified. Motor: Strength is 5/5 in the bilateral upper and lower extremities.   Shoulder shrug is equal and symmetric.  There is no pronator drift.  Movement examination: Tone: There is normal  tone in the bilateral upper extremities.  The tone in the lower extremities is normal.  Abnormal movements: None Coordination:  There is no decremation with RAM's, with any form of RAMS, including alternating supination and pronation of the forearm, hand opening and closing, finger taps, heel taps and toe taps.  She does have significant dysmetria with finger-nose-finger bilaterally that is somewhat asymmetric with the right being worse than the left.  She has trouble with H-S but is able to do it Gait and Station: did not ambulate pt today since don't want her walking and in [facility]  ASSESSMENT/PLAN:  1.  Spinocerebellar ataxia type 6  -Discussed the nature and pathophysiology with the patient and her husband.  They understand that this is autosomal dominant.  She has had this for over 20 years.  This has been fairly slowly progressive for her.  She is being very well taking care of by her husband.  They live at [facility], but lived independently and wished to remain that way for as long as they can.  She has some pseudobulbar speech and they will let me know if he changes her mind and speech therapy.  I really would like to get a modified barium swallow study given coughing with liquids, but she wants to hold on that.  We talked about risks for aspiration.  They understood.    -Her husband talked about mood fluctuations.  We talked about various mood medications, but ultimately they both decided that nothing was necessary.  They will let me know if they change their mind.   2.  constipation  -discussed that verapamil may be source of part of constipation.    3.  F/u 1 year   Cc:  [name], MD

## 2017-11-03 ENCOUNTER — Ambulatory Visit (INDEPENDENT_AMBULATORY_CARE_PROVIDER_SITE_OTHER): Payer: Medicare Other | Admitting: Neurology

## 2017-11-03 ENCOUNTER — Encounter: Payer: Self-pay | Admitting: Neurology

## 2017-11-03 VITALS — BP 112/62 | HR 60

## 2017-11-03 DIAGNOSIS — G118 Other hereditary ataxias: Secondary | ICD-10-CM | POA: Diagnosis not present

## 2017-11-03 DIAGNOSIS — K5901 Slow transit constipation: Secondary | ICD-10-CM | POA: Diagnosis not present

## 2017-11-03 NOTE — Patient Instructions (Signed)
Ask Dr. Felipa Eth if he thinks that verapamil could be contributing to the constipation.

## 2017-11-10 DIAGNOSIS — I10 Essential (primary) hypertension: Secondary | ICD-10-CM | POA: Diagnosis not present

## 2017-11-10 DIAGNOSIS — Z1331 Encounter for screening for depression: Secondary | ICD-10-CM | POA: Diagnosis not present

## 2017-11-10 DIAGNOSIS — K5901 Slow transit constipation: Secondary | ICD-10-CM | POA: Diagnosis not present

## 2017-11-17 ENCOUNTER — Other Ambulatory Visit: Payer: Self-pay | Admitting: Geriatric Medicine

## 2017-11-17 ENCOUNTER — Ambulatory Visit
Admission: RE | Admit: 2017-11-17 | Discharge: 2017-11-17 | Disposition: A | Discharge status: Home / Self Care | Payer: Medicare Other | Source: Ambulatory Visit | Attending: Geriatric Medicine | Admitting: Geriatric Medicine

## 2017-11-17 DIAGNOSIS — N6320 Unspecified lump in the left breast, unspecified quadrant: Secondary | ICD-10-CM | POA: Diagnosis not present

## 2017-11-17 DIAGNOSIS — N6310 Unspecified lump in the right breast, unspecified quadrant: Secondary | ICD-10-CM

## 2017-11-17 DIAGNOSIS — R928 Other abnormal and inconclusive findings on diagnostic imaging of breast: Secondary | ICD-10-CM

## 2017-11-25 ENCOUNTER — Other Ambulatory Visit: Payer: Self-pay | Admitting: Geriatric Medicine

## 2017-11-25 DIAGNOSIS — N63 Unspecified lump in unspecified breast: Secondary | ICD-10-CM

## 2017-11-26 DIAGNOSIS — H353122 Nonexudative age-related macular degeneration, left eye, intermediate dry stage: Secondary | ICD-10-CM | POA: Diagnosis not present

## 2017-11-26 DIAGNOSIS — H43813 Vitreous degeneration, bilateral: Secondary | ICD-10-CM | POA: Diagnosis not present

## 2017-11-26 DIAGNOSIS — D3132 Benign neoplasm of left choroid: Secondary | ICD-10-CM | POA: Diagnosis not present

## 2017-11-26 DIAGNOSIS — H353211 Exudative age-related macular degeneration, right eye, with active choroidal neovascularization: Secondary | ICD-10-CM | POA: Diagnosis not present

## 2017-12-08 DIAGNOSIS — R05 Cough: Secondary | ICD-10-CM | POA: Diagnosis not present

## 2017-12-08 DIAGNOSIS — I10 Essential (primary) hypertension: Secondary | ICD-10-CM | POA: Diagnosis not present

## 2018-01-26 DIAGNOSIS — H353211 Exudative age-related macular degeneration, right eye, with active choroidal neovascularization: Secondary | ICD-10-CM | POA: Diagnosis not present

## 2018-02-09 DIAGNOSIS — K219 Gastro-esophageal reflux disease without esophagitis: Secondary | ICD-10-CM | POA: Diagnosis not present

## 2018-02-09 DIAGNOSIS — I10 Essential (primary) hypertension: Secondary | ICD-10-CM | POA: Diagnosis not present

## 2018-03-09 DIAGNOSIS — H353211 Exudative age-related macular degeneration, right eye, with active choroidal neovascularization: Secondary | ICD-10-CM | POA: Diagnosis not present

## 2018-04-26 DIAGNOSIS — H353211 Exudative age-related macular degeneration, right eye, with active choroidal neovascularization: Secondary | ICD-10-CM | POA: Diagnosis not present

## 2018-04-26 DIAGNOSIS — Z961 Presence of intraocular lens: Secondary | ICD-10-CM | POA: Diagnosis not present

## 2018-04-26 DIAGNOSIS — H353122 Nonexudative age-related macular degeneration, left eye, intermediate dry stage: Secondary | ICD-10-CM | POA: Diagnosis not present

## 2018-04-26 DIAGNOSIS — H35453 Secondary pigmentary degeneration, bilateral: Secondary | ICD-10-CM | POA: Diagnosis not present

## 2018-04-26 DIAGNOSIS — H3122 Choroidal dystrophy (central areolar) (generalized) (peripapillary): Secondary | ICD-10-CM | POA: Diagnosis not present

## 2018-04-26 DIAGNOSIS — H35362 Drusen (degenerative) of macula, left eye: Secondary | ICD-10-CM | POA: Diagnosis not present

## 2018-04-27 DIAGNOSIS — Z1389 Encounter for screening for other disorder: Secondary | ICD-10-CM | POA: Diagnosis not present

## 2018-04-27 DIAGNOSIS — N3941 Urge incontinence: Secondary | ICD-10-CM | POA: Diagnosis not present

## 2018-04-27 DIAGNOSIS — Z79899 Other long term (current) drug therapy: Secondary | ICD-10-CM | POA: Diagnosis not present

## 2018-04-27 DIAGNOSIS — E7800 Pure hypercholesterolemia, unspecified: Secondary | ICD-10-CM | POA: Diagnosis not present

## 2018-04-27 DIAGNOSIS — I10 Essential (primary) hypertension: Secondary | ICD-10-CM | POA: Diagnosis not present

## 2018-04-27 DIAGNOSIS — E559 Vitamin D deficiency, unspecified: Secondary | ICD-10-CM | POA: Diagnosis not present

## 2018-04-27 DIAGNOSIS — K219 Gastro-esophageal reflux disease without esophagitis: Secondary | ICD-10-CM | POA: Diagnosis not present

## 2018-04-27 DIAGNOSIS — Z0000 Encounter for general adult medical examination without abnormal findings: Secondary | ICD-10-CM | POA: Diagnosis not present

## 2018-04-27 DIAGNOSIS — G119 Hereditary ataxia, unspecified: Secondary | ICD-10-CM | POA: Diagnosis not present

## 2018-04-28 ENCOUNTER — Other Ambulatory Visit: Payer: Self-pay | Admitting: Geriatric Medicine

## 2018-04-28 DIAGNOSIS — M810 Age-related osteoporosis without current pathological fracture: Secondary | ICD-10-CM

## 2018-04-29 ENCOUNTER — Other Ambulatory Visit: Payer: Self-pay | Admitting: Geriatric Medicine

## 2018-04-29 DIAGNOSIS — Z13820 Encounter for screening for osteoporosis: Secondary | ICD-10-CM

## 2018-05-24 ENCOUNTER — Ambulatory Visit
Admission: RE | Admit: 2018-05-24 | Discharge: 2018-05-24 | Disposition: A | Discharge status: Home / Self Care | Payer: Medicare Other | Source: Ambulatory Visit | Attending: Geriatric Medicine | Admitting: Geriatric Medicine

## 2018-05-24 ENCOUNTER — Other Ambulatory Visit: Payer: Medicare Other

## 2018-05-24 DIAGNOSIS — N6321 Unspecified lump in the left breast, upper outer quadrant: Secondary | ICD-10-CM | POA: Diagnosis not present

## 2018-05-24 DIAGNOSIS — N63 Unspecified lump in unspecified breast: Secondary | ICD-10-CM

## 2018-05-24 DIAGNOSIS — N6312 Unspecified lump in the right breast, upper inner quadrant: Secondary | ICD-10-CM | POA: Diagnosis not present

## 2018-05-24 DIAGNOSIS — R928 Other abnormal and inconclusive findings on diagnostic imaging of breast: Secondary | ICD-10-CM | POA: Diagnosis not present

## 2018-05-24 DIAGNOSIS — N6323 Unspecified lump in the left breast, lower outer quadrant: Secondary | ICD-10-CM | POA: Diagnosis not present

## 2018-05-24 DIAGNOSIS — N6314 Unspecified lump in the right breast, lower inner quadrant: Secondary | ICD-10-CM | POA: Diagnosis not present

## 2018-06-07 DIAGNOSIS — H353211 Exudative age-related macular degeneration, right eye, with active choroidal neovascularization: Secondary | ICD-10-CM | POA: Diagnosis not present

## 2018-06-22 ENCOUNTER — Other Ambulatory Visit: Payer: Medicare Other

## 2018-09-09 ENCOUNTER — Other Ambulatory Visit: Payer: Medicare Other

## 2018-10-11 DIAGNOSIS — H353211 Exudative age-related macular degeneration, right eye, with active choroidal neovascularization: Secondary | ICD-10-CM | POA: Diagnosis not present

## 2018-10-25 DIAGNOSIS — I700 Atherosclerosis of aorta: Secondary | ICD-10-CM | POA: Diagnosis not present

## 2018-10-25 DIAGNOSIS — E7800 Pure hypercholesterolemia, unspecified: Secondary | ICD-10-CM | POA: Diagnosis not present

## 2018-10-25 DIAGNOSIS — I10 Essential (primary) hypertension: Secondary | ICD-10-CM | POA: Diagnosis not present

## 2018-10-25 NOTE — Progress Notes (Signed)
Virtual Visit via Video Note The purpose of this virtual visit is to provide medical care while limiting exposure to the novel coronavirus.    Consent was obtained for video visit:  Yes.   Answered questions that patient had about telehealth interaction:  Yes.   I discussed the limitations, risks, security and privacy concerns of performing an evaluation and management service by telemedicine. I also discussed with the patient that there may be a patient responsible charge related to this service. The patient expressed understanding and agreed to proceed.  Pt location: Home Physician Location: office Name of referring provider:  [name], MD I connected with [name] at patients initiation/request on 10/26/2018 at  3:00 PM EDT by video enabled telemedicine application and verified that I am speaking with the correct person using two identifiers. Pt MRN:  [id number] Pt DOB:  March 26, 1946 Video Participants:  Wendall Stade;  husband   History of Present Illness:  Patient seen today in follow-up for spinocerebellar ataxia type 6.  She is accompanied by her husband who supplements the history.  She has had several falls since our last visit.  Husband states that she "sits down and mostly injures her pride."  hasnt been able to get to the pool because of the pandemic.  She uses her power wheelchair when around the apartment complex and her walker within the apartment complex.  She has declined a modified barium swallow in the past and reports today that her swallowing has been okay but when she is talking she will cough some.  Her husband states that she "gets choked sometimes."  No new sickness.     Current Outpatient Medications on File Prior to Visit  Medication Sig Dispense Refill  . Ascorbic Acid (VITAMIN C) 100 MG tablet Take 100 mg by mouth daily.    Marland Kitchen aspirin EC 81 MG tablet Take 81 mg by mouth daily.    . Calcium Carbonate-Vitamin D (CALCIUM 600+D) 600-400 MG-UNIT tablet Take 1  tablet by mouth 2 (two) times daily.    . Cholecalciferol (VITAMIN D-3) 5000 units TABS Take 5,000 Units by mouth daily.    . famotidine-calcium carbonate-magnesium hydroxide (PEPCID COMPLETE) 10-800-165 MG chewable tablet Chew 1 tablet by mouth daily.    . hydrocortisone cream 1 % Apply 1 application topically daily as needed (ear itching).    . mirabegron ER (MYRBETRIQ) 25 MG TB24 tablet Take 25 mg by mouth daily.    . Multiple Vitamins-Minerals (ICAPS AREDS 2) CAPS Take 1 capsule by mouth 2 (two) times daily.    . multivitamin-lutein (OCUVITE-LUTEIN) CAPS capsule Take 1 capsule by mouth daily.    . Polyvinyl Alcohol-Povidone (REFRESH OP) Place 1 drop into the right eye at bedtime.    . simvastatin (ZOCOR) 20 MG tablet Take 20 mg by mouth at bedtime.     . sodium chloride (OCEAN) 0.65 % SOLN nasal spray Place 1 spray into both nostrils as needed for congestion.    . triamcinolone (KENALOG) 0.025 % cream Apply 1 application topically 2 (two) times daily.    . verapamil (CALAN-SR) 240 MG CR tablet Take 240 mg by mouth at bedtime.    Levy Sjogren 75 MG tablet      No current facility-administered medications on file prior to visit.      Observations/Objective:   Vitals:   10/26/18 1432  Weight: 190 lb (86.2 kg)  Height: 5\' 3"  (1.6 m)   GEN:  The patient appears stated age and is  in NAD.  Neurological examination:  Orientation: The patient is alert and oriented x3. Cranial nerves: There is good facial symmetry. There is no facial hypomimia.  The speech is fluent and pseudobulbar. Soft palate rises symmetrically and there is no tongue deviation. Hearing is intact to conversational tone. Motor: Strength is at least antigravity x 4.   Shoulder shrug is equal and symmetric.  There is no pronator drift.  Movement examination: Tone: unable Abnormal movements: none Coordination:  There is significant ataxia with finger to nose bilaterally Gait and Station: The patient is very ataxic with  ambulation with her walker and has a near fall in the turn even with her walker    Assessment and Plan:   1.  Spinocerebellar ataxia type VI  -Patient has been long diagnosed.  She is well taken care of by her husband.  [facility] live at [facility].  She does have some pseudobulbar speech, but does not want speech therapy.  She has some mild dysphagia, but refuses a modified barium swallow.  She understands risk for aspiration.    -discussed that I would like her not to walk at all and husband states that they have made a rule that she is not to walk unless he is right there with her.   Discussed risks associated with falls, including but no limited to hip fx, brain bleeds.  2. F/u 1 year, sooner should new issues arise.    Follow Up Instructions:    -I discussed the assessment and treatment plan with the patient. The patient was provided an opportunity to ask questions and all were answered. The patient agreed with the plan and demonstrated an understanding of the instructions.   The patient was advised to call back or seek an in-person evaluation if the symptoms worsen or if the condition fails to improve as anticipated.    Total Time spent in visit with the patient was:  15 min, of which more than 50% of the time was spent in counseling and/or coordinating care on safety.   Pt understands and agrees with the plan of care outlined.     Andrea Bogus, DO

## 2018-10-26 ENCOUNTER — Encounter: Payer: Self-pay | Admitting: Neurology

## 2018-10-26 ENCOUNTER — Other Ambulatory Visit: Payer: Self-pay

## 2018-10-26 ENCOUNTER — Telehealth (INDEPENDENT_AMBULATORY_CARE_PROVIDER_SITE_OTHER): Payer: Medicare Other | Admitting: Neurology

## 2018-10-26 VITALS — Ht 63.0 in | Wt 190.0 lb

## 2018-10-26 DIAGNOSIS — G118 Other hereditary ataxias: Secondary | ICD-10-CM

## 2018-11-04 ENCOUNTER — Ambulatory Visit: Payer: Medicare Other | Admitting: Neurology

## 2018-11-23 ENCOUNTER — Ambulatory Visit
Admission: RE | Admit: 2018-11-23 | Discharge: 2018-11-23 | Disposition: A | Discharge status: Home / Self Care | Payer: Medicare Other | Source: Ambulatory Visit | Attending: Geriatric Medicine | Admitting: Geriatric Medicine

## 2018-11-23 ENCOUNTER — Other Ambulatory Visit: Payer: Self-pay

## 2018-11-23 DIAGNOSIS — Z13820 Encounter for screening for osteoporosis: Secondary | ICD-10-CM

## 2018-11-23 DIAGNOSIS — M85852 Other specified disorders of bone density and structure, left thigh: Secondary | ICD-10-CM | POA: Diagnosis not present

## 2018-11-23 DIAGNOSIS — Z780 Asymptomatic menopausal state: Secondary | ICD-10-CM | POA: Diagnosis not present

## 2018-12-10 DIAGNOSIS — H2513 Age-related nuclear cataract, bilateral: Secondary | ICD-10-CM | POA: Diagnosis not present

## 2019-01-03 DIAGNOSIS — H353221 Exudative age-related macular degeneration, left eye, with active choroidal neovascularization: Secondary | ICD-10-CM | POA: Diagnosis not present

## 2019-01-17 DIAGNOSIS — H353221 Exudative age-related macular degeneration, left eye, with active choroidal neovascularization: Secondary | ICD-10-CM | POA: Diagnosis not present

## 2019-01-17 DIAGNOSIS — H353211 Exudative age-related macular degeneration, right eye, with active choroidal neovascularization: Secondary | ICD-10-CM | POA: Diagnosis not present

## 2019-01-17 DIAGNOSIS — H35722 Serous detachment of retinal pigment epithelium, left eye: Secondary | ICD-10-CM | POA: Diagnosis not present

## 2019-01-17 DIAGNOSIS — H35362 Drusen (degenerative) of macula, left eye: Secondary | ICD-10-CM | POA: Diagnosis not present

## 2019-01-25 DIAGNOSIS — E7800 Pure hypercholesterolemia, unspecified: Secondary | ICD-10-CM | POA: Diagnosis not present

## 2019-01-25 DIAGNOSIS — Z79899 Other long term (current) drug therapy: Secondary | ICD-10-CM | POA: Diagnosis not present

## 2019-01-25 DIAGNOSIS — Z23 Encounter for immunization: Secondary | ICD-10-CM | POA: Diagnosis not present

## 2019-01-25 DIAGNOSIS — I700 Atherosclerosis of aorta: Secondary | ICD-10-CM | POA: Diagnosis not present

## 2019-01-25 DIAGNOSIS — I10 Essential (primary) hypertension: Secondary | ICD-10-CM | POA: Diagnosis not present

## 2019-02-07 DIAGNOSIS — H353221 Exudative age-related macular degeneration, left eye, with active choroidal neovascularization: Secondary | ICD-10-CM | POA: Diagnosis not present

## 2019-02-23 DIAGNOSIS — H353211 Exudative age-related macular degeneration, right eye, with active choroidal neovascularization: Secondary | ICD-10-CM | POA: Diagnosis not present

## 2019-03-14 DIAGNOSIS — H353221 Exudative age-related macular degeneration, left eye, with active choroidal neovascularization: Secondary | ICD-10-CM | POA: Diagnosis not present

## 2019-04-20 DIAGNOSIS — H353221 Exudative age-related macular degeneration, left eye, with active choroidal neovascularization: Secondary | ICD-10-CM | POA: Diagnosis not present

## 2019-04-20 IMAGING — CT CT ANGIO CHEST
2 of 7 series · 18 of 46 positions shown · IV contrast (iopamidol)
Comparison: Current chest radiograph.

CLINICAL DATA: Centralized chest pressure. No reported shortness of
breath.

EXAM:
CT ANGIOGRAPHY CHEST WITH CONTRAST
TECHNIQUE: Multidetector CT imaging of the chest was performed using the
standard protocol during bolus administration of intravenous
contrast. Multiplanar CT image reconstructions and MIPs were
obtained to evaluate the vascular anatomy.
CONTRAST:  57mL 88E12E-IZM IOPAMIDOL (88E12E-IZM) INJECTION 76%

[Series 6: thins · axial · 0.69mm/px · z∈[+1100,+1346]mm · 15 of 396 slices shown]
[im 22/396  lung]
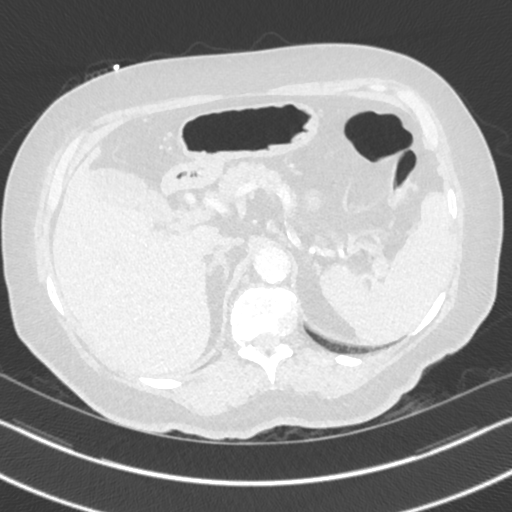
[im 44/396  soft-tissue]
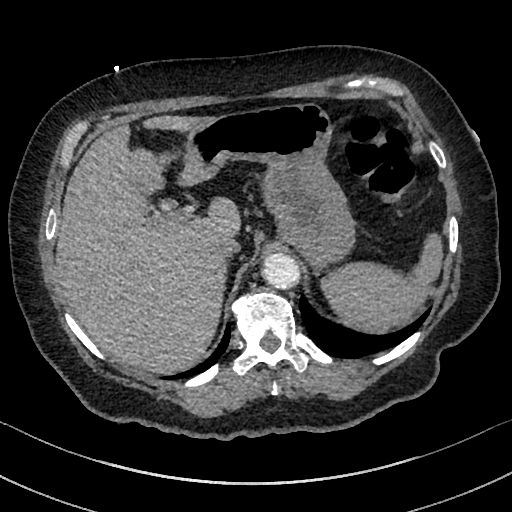
[im 66/396  lung]
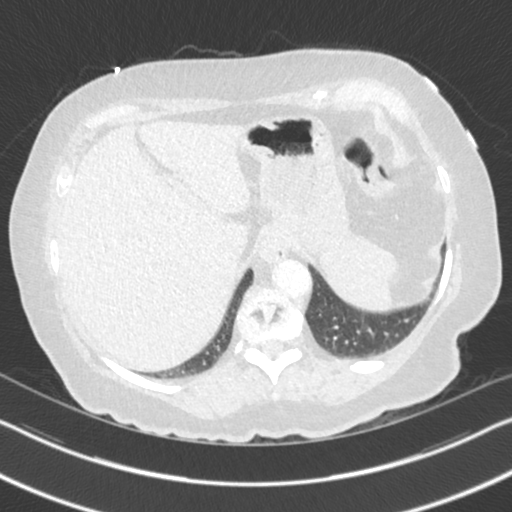
[im 88/396  soft-tissue]
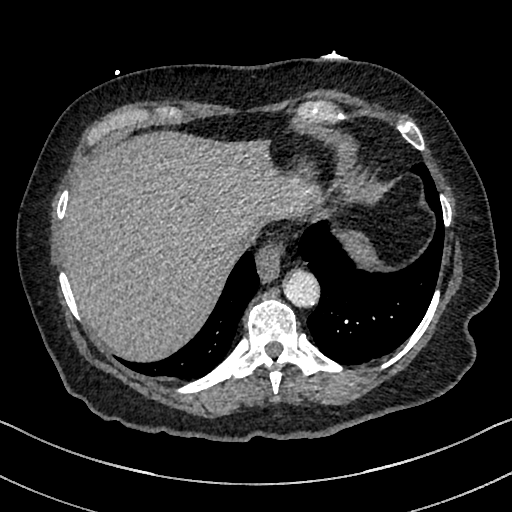
[im 132/396  lung]
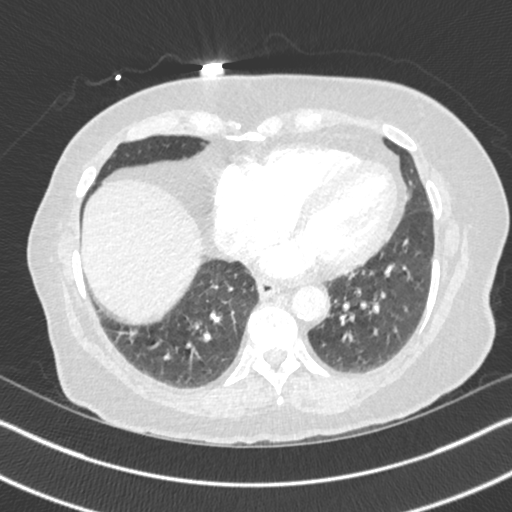
[im 154/396  soft-tissue]
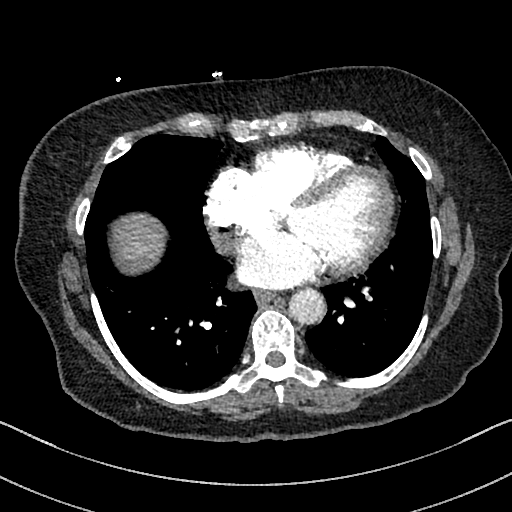
[im 176/396  lung]
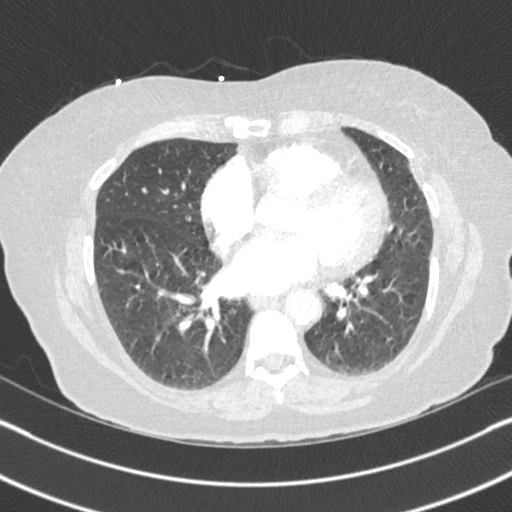
[im 198/396  soft-tissue]
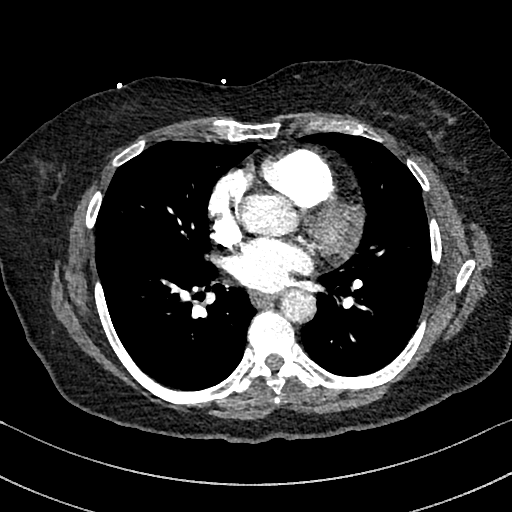
[im 220/396  lung]
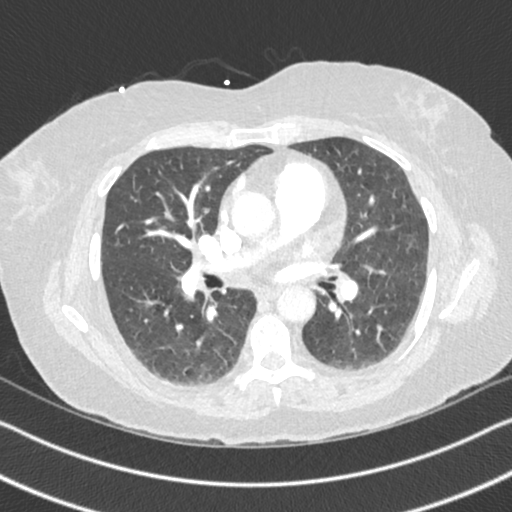
[im 242/396  soft-tissue]
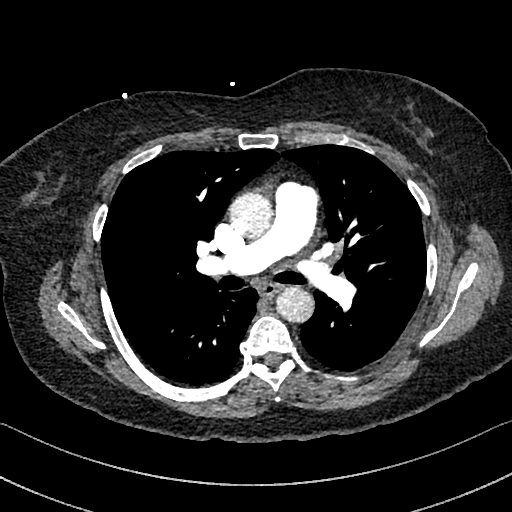
[im 264/396  lung]
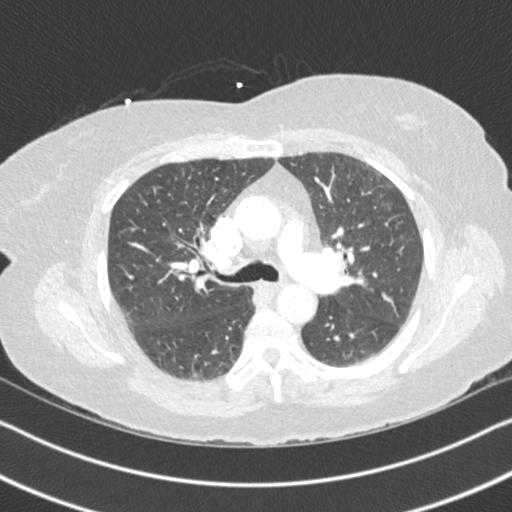
[im 308/396  soft-tissue]
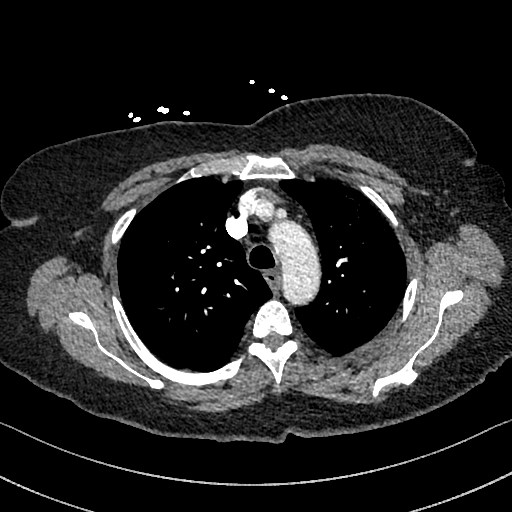
[im 330/396  lung]
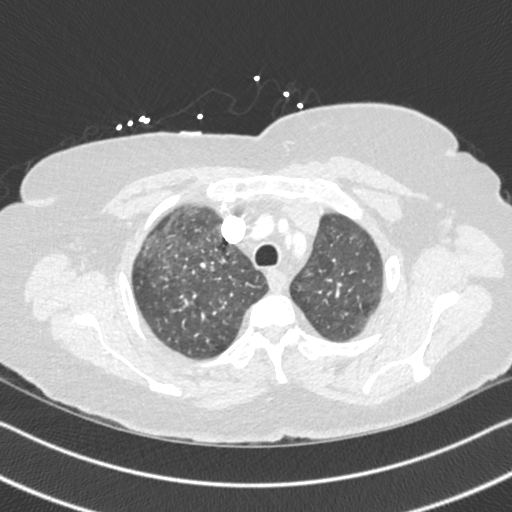
[im 352/396  soft-tissue]
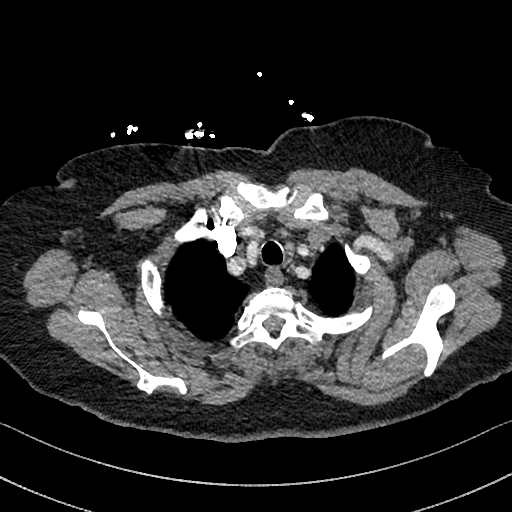
[im 374/396  lung]
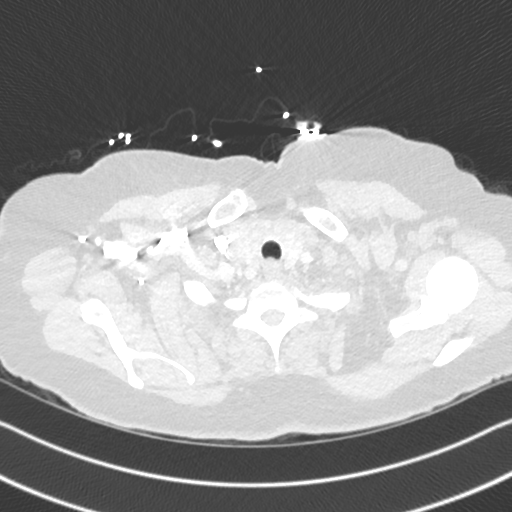

[Series 8: cor · coronal · 0.55mm/px · 3 of 123 slices shown]
[im 31/123  soft-tissue]
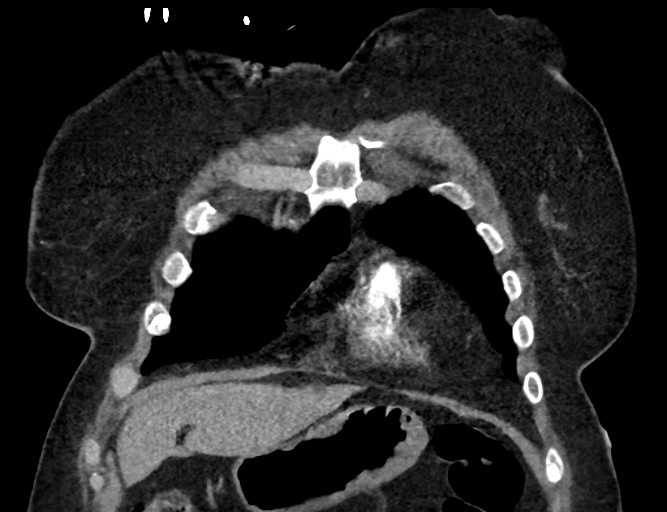
[im 62/123  soft-tissue]
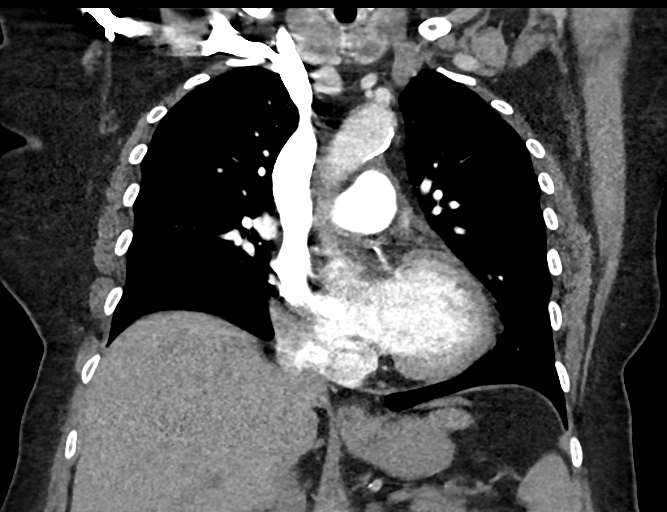
[im 92/123  soft-tissue]
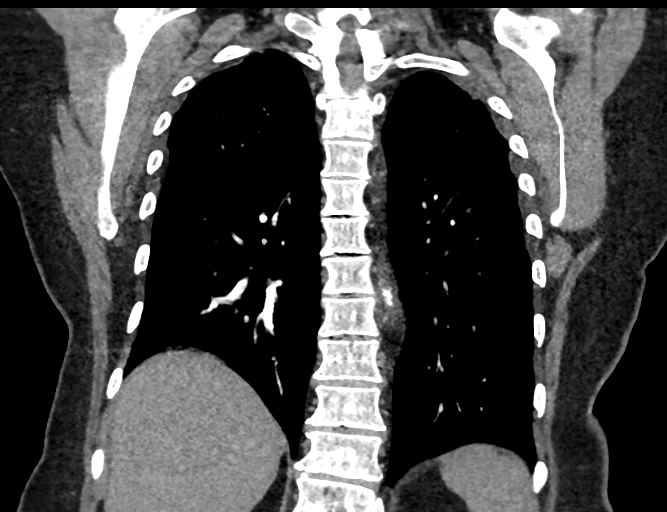

[18 of 46 positions shown; findings below may reference images not displayed]

FINDINGS: Cardiovascular: Satisfactory opacification of the pulmonary arteries
to the segmental level. No evidence of pulmonary embolism. Normal
heart size. No pericardial effusion. Mild left coronary artery
calcifications. Great vessels are normal in caliber. Mild aortic
atherosclerosis.

Mediastinum/Nodes: Small poorly defined hypoattenuating thyroid
nodules suggested. No neck base or axillary masses or pathologically
enlarged lymph nodes. No mediastinal or hilar masses or adenopathy.
Trachea is patent. Esophagus is unremarkable.

Lungs/Pleura: There are hazy areas of peribronchovascular
ground-glass opacity in the anterior upper lobes. Remainder of the
lungs is clear. No pleural effusion or pneumothorax.

Upper Abdomen: Nonspecific 17 mm low-density lesion in the anterior
left liver lobe possibly a cyst or hemangioma, incompletely
characterized on this exam. No acute findings in the upper abdomen.

Musculoskeletal: No fracture or acute finding. No osteoblastic or
osteolytic lesions.

Review of the MIP images confirms the above findings.
IMPRESSION: 1. No evidence of a pulmonary embolism.
2. Ground-glass upper lobe opacities. Findings appear most
consistent with infection or inflammation.
3. Aortic atherosclerosis. Mild left coronary artery calcifications.

Aortic Atherosclerosis ([id number].Q).

## 2019-05-11 DIAGNOSIS — H353211 Exudative age-related macular degeneration, right eye, with active choroidal neovascularization: Secondary | ICD-10-CM | POA: Diagnosis not present

## 2019-05-23 DIAGNOSIS — H353221 Exudative age-related macular degeneration, left eye, with active choroidal neovascularization: Secondary | ICD-10-CM | POA: Diagnosis not present

## 2019-06-13 DIAGNOSIS — H353211 Exudative age-related macular degeneration, right eye, with active choroidal neovascularization: Secondary | ICD-10-CM | POA: Diagnosis not present

## 2019-06-27 DIAGNOSIS — H353221 Exudative age-related macular degeneration, left eye, with active choroidal neovascularization: Secondary | ICD-10-CM | POA: Diagnosis not present

## 2019-06-28 ENCOUNTER — Other Ambulatory Visit: Payer: Self-pay | Admitting: Geriatric Medicine

## 2019-06-28 DIAGNOSIS — N6310 Unspecified lump in the right breast, unspecified quadrant: Secondary | ICD-10-CM

## 2019-07-15 ENCOUNTER — Ambulatory Visit: Payer: Medicare Other

## 2019-07-15 ENCOUNTER — Ambulatory Visit
Admission: RE | Admit: 2019-07-15 | Discharge: 2019-07-15 | Disposition: A | Discharge status: Home / Self Care | Payer: Medicare Other | Source: Ambulatory Visit | Attending: Geriatric Medicine | Admitting: Geriatric Medicine

## 2019-07-15 ENCOUNTER — Other Ambulatory Visit: Payer: Self-pay

## 2019-07-15 DIAGNOSIS — R928 Other abnormal and inconclusive findings on diagnostic imaging of breast: Secondary | ICD-10-CM | POA: Diagnosis not present

## 2019-07-15 DIAGNOSIS — N6310 Unspecified lump in the right breast, unspecified quadrant: Secondary | ICD-10-CM

## 2019-07-18 DIAGNOSIS — H353211 Exudative age-related macular degeneration, right eye, with active choroidal neovascularization: Secondary | ICD-10-CM | POA: Diagnosis not present

## 2019-07-22 DIAGNOSIS — G119 Hereditary ataxia, unspecified: Secondary | ICD-10-CM | POA: Diagnosis not present

## 2019-07-22 DIAGNOSIS — Z1159 Encounter for screening for other viral diseases: Secondary | ICD-10-CM | POA: Diagnosis not present

## 2019-07-22 DIAGNOSIS — Z1389 Encounter for screening for other disorder: Secondary | ICD-10-CM | POA: Diagnosis not present

## 2019-07-22 DIAGNOSIS — E7800 Pure hypercholesterolemia, unspecified: Secondary | ICD-10-CM | POA: Diagnosis not present

## 2019-07-22 DIAGNOSIS — M7061 Trochanteric bursitis, right hip: Secondary | ICD-10-CM | POA: Diagnosis not present

## 2019-07-22 DIAGNOSIS — Z0000 Encounter for general adult medical examination without abnormal findings: Secondary | ICD-10-CM | POA: Diagnosis not present

## 2019-07-22 DIAGNOSIS — Z79899 Other long term (current) drug therapy: Secondary | ICD-10-CM | POA: Diagnosis not present

## 2019-07-22 DIAGNOSIS — I10 Essential (primary) hypertension: Secondary | ICD-10-CM | POA: Diagnosis not present

## 2019-07-22 DIAGNOSIS — I700 Atherosclerosis of aorta: Secondary | ICD-10-CM | POA: Diagnosis not present

## 2019-07-29 DIAGNOSIS — M545 Low back pain: Secondary | ICD-10-CM | POA: Diagnosis not present

## 2019-07-29 DIAGNOSIS — M7061 Trochanteric bursitis, right hip: Secondary | ICD-10-CM | POA: Diagnosis not present

## 2019-08-08 DIAGNOSIS — H353221 Exudative age-related macular degeneration, left eye, with active choroidal neovascularization: Secondary | ICD-10-CM | POA: Diagnosis not present

## 2019-08-22 DIAGNOSIS — H353211 Exudative age-related macular degeneration, right eye, with active choroidal neovascularization: Secondary | ICD-10-CM | POA: Diagnosis not present

## 2019-09-14 DIAGNOSIS — H353221 Exudative age-related macular degeneration, left eye, with active choroidal neovascularization: Secondary | ICD-10-CM | POA: Diagnosis not present

## 2019-09-15 ENCOUNTER — Other Ambulatory Visit: Payer: Self-pay | Admitting: Sports Medicine

## 2019-09-15 ENCOUNTER — Other Ambulatory Visit (HOSPITAL_COMMUNITY): Payer: Self-pay | Admitting: Sports Medicine

## 2019-09-15 DIAGNOSIS — M545 Low back pain: Secondary | ICD-10-CM | POA: Diagnosis not present

## 2019-09-15 DIAGNOSIS — M5440 Lumbago with sciatica, unspecified side: Secondary | ICD-10-CM

## 2019-10-03 ENCOUNTER — Other Ambulatory Visit: Payer: Self-pay

## 2019-10-03 ENCOUNTER — Ambulatory Visit (HOSPITAL_COMMUNITY)
Admission: RE | Admit: 2019-10-03 | Discharge: 2019-10-03 | Disposition: A | Discharge status: Home / Self Care | Payer: Medicare Other | Source: Ambulatory Visit | Attending: Sports Medicine | Admitting: Sports Medicine

## 2019-10-03 DIAGNOSIS — M5440 Lumbago with sciatica, unspecified side: Secondary | ICD-10-CM

## 2019-10-24 NOTE — Progress Notes (Signed)
Assessment/Plan:   1.  SCA-6  -Patient is taking care of very well by her husband.  They live independently at [facility].  Patient declines speech therapy for the pseudobulbar speech.  She has mild dysphagia, but does not want to swallow study, but understands the risk for aspiration.    -no walking!  -pt and I discussed advanced directives.  She expresses dnr.    2.  Near syncope  -may be due to irbesartan.  Events more frequently.  Will make sure that PCP aware as husband states that pcp recently   66.  Meralgia paresthetica, right  -seeing orthopedics   4.  Follow up 1 year  Subjective:   Andrea Arroyo was seen today in follow up for SCA-6.  My previous records as well as any outside records available were reviewed prior to todays visit.  Pt is accompanied by her husband who supplements the history.  She generally gets around using her power wheelchair.  Pt has had 3 falls since last visit.  With one, both the patient and her husband fell.  Now, the husband states they get around the home with the [facility] and don't use the walker.  No choking.  Swallowing is good.   Pt has had lightheadedness, esp when she gets hot (like when she gets out of the shower).  She is seeing ortho for pain in the back and leg.  She has had injection for a hip spur and that helped but having paresthesias in the leg.   CURRENT MEDICATIONS:  Outpatient Encounter Medications as of 10/27/2019  Medication Sig  . Ascorbic Acid (VITAMIN C) 100 MG tablet Take 100 mg by mouth daily.  . Calcium Carbonate-Vitamin D (CALCIUM 600+D) 600-400 MG-UNIT tablet Take 1 tablet by mouth 2 (two) times daily.  . Cholecalciferol (VITAMIN D-3) 5000 units TABS Take 5,000 Units by mouth daily.  . famotidine-calcium carbonate-magnesium hydroxide (PEPCID COMPLETE) 10-800-165 MG chewable tablet Chew 1 tablet by mouth daily.  . hydrocortisone cream 1 % Apply 1 application topically daily as needed (ear itching).  . irbesartan (AVAPRO) 75 MG  tablet Take 75 mg by mouth daily.  . mirabegron ER (MYRBETRIQ) 25 MG TB24 tablet Take 25 mg by mouth daily.  . Multiple Vitamin (MULTIVITAMIN ADULT) TABS Take 1 tablet by mouth daily.  . Multiple Vitamins-Minerals (ICAPS AREDS 2) CAPS Take 1 capsule by mouth 2 (two) times daily.  . multivitamin-lutein (OCUVITE-LUTEIN) CAPS capsule Take 1 capsule by mouth daily.  . nitroGLYCERIN (NITROSTAT) 0.4 MG SL tablet Place 0.4 mg under the tongue every 5 (five) minutes as needed for chest pain.  . Polyvinyl Alcohol-Povidone (REFRESH OP) Place 1 drop into the right eye at bedtime.  . simvastatin (ZOCOR) 20 MG tablet Take 20 mg by mouth at bedtime.   . sodium chloride (OCEAN) 0.65 % SOLN nasal spray Place 1 spray into both nostrils as needed for congestion.  . triamcinolone (KENALOG) 0.025 % cream Apply 1 application topically 2 (two) times daily.  . [DISCONTINUED] AVAPRO 75 MG tablet   . [DISCONTINUED] aspirin EC 81 MG tablet Take 81 mg by mouth daily. (Patient not taking: Reported on 10/27/2019)  . [DISCONTINUED] verapamil (CALAN-SR) 240 MG CR tablet Take 240 mg by mouth at bedtime. (Patient not taking: Reported on 10/27/2019)   No facility-administered encounter medications on file as of 10/27/2019.     Objective:   PHYSICAL EXAMINATION:    VITALS:   Vitals:   10/27/19 1436  BP: 128/83  Pulse: 65  SpO2: 95%  Weight: 194 lb (88 kg)  Height: 5\' 3"  (1.6 m)    GEN:  The patient appears stated age and is in NAD. HEENT:  Normocephalic, atraumatic.  The mucous membranes are moist. The superficial temporal arteries are without ropiness or tenderness. CV:  RRR Lungs:  CTAB Neck/HEME:  There are no carotid bruits bilaterally.  Neurological examination:  Orientation: The patient is alert and oriented x3. Cranial nerves: There is good facial symmetry.The speech is fluent and bulbar in quality. Soft palate rises symmetrically and there is no tongue deviation. Hearing is intact to conversational  tone. Sensation: Sensation is intact to light touch throughout Motor: Strength is at least antigravity x4.  Movement examination: Tone: There is normal tone in the UE/LE Abnormal movements:  no tremor.  No myoclonus.  No asterixis.   Coordination: Patient is significantly ataxic with finger-to-nose and somewhat so with heel shin Gait and Station: Not tested, wheelchair-bound    Total time spent on today's visit was 30 minutes, including both face-to-face time and nonface-to-face time.  Time included that spent on review of records (prior notes available to me/labs/imaging if pertinent), discussing treatment and goals, answering patient's questions and coordinating care.  Cc:  [name], MD

## 2019-10-27 ENCOUNTER — Ambulatory Visit (INDEPENDENT_AMBULATORY_CARE_PROVIDER_SITE_OTHER): Payer: Medicare Other | Admitting: Neurology

## 2019-10-27 ENCOUNTER — Encounter: Payer: Self-pay | Admitting: Neurology

## 2019-10-27 ENCOUNTER — Other Ambulatory Visit: Payer: Self-pay

## 2019-10-27 VITALS — BP 128/83 | HR 65 | Ht 63.0 in | Wt 194.0 lb

## 2019-10-27 DIAGNOSIS — R55 Syncope and collapse: Secondary | ICD-10-CM

## 2019-10-27 DIAGNOSIS — G118 Other hereditary ataxias: Secondary | ICD-10-CM

## 2019-10-28 ENCOUNTER — Telehealth: Payer: Self-pay

## 2019-10-28 NOTE — Telephone Encounter (Signed)
Patient seen in office yesterday for yearly visit. Dr Tat requested that  I contact Dr Carlyle Lipa office to make him aware of the patients blood pressure dropping.    Left message for his medical assistant [place] with Dr Doristine Devoid concerns. Advised her to contact the office with any questions or concerns.

## 2019-12-05 DIAGNOSIS — H353211 Exudative age-related macular degeneration, right eye, with active choroidal neovascularization: Secondary | ICD-10-CM | POA: Diagnosis not present

## 2019-12-07 DIAGNOSIS — H353221 Exudative age-related macular degeneration, left eye, with active choroidal neovascularization: Secondary | ICD-10-CM | POA: Diagnosis not present

## 2020-01-05 DIAGNOSIS — R2681 Unsteadiness on feet: Secondary | ICD-10-CM | POA: Diagnosis not present

## 2020-01-05 DIAGNOSIS — G119 Hereditary ataxia, unspecified: Secondary | ICD-10-CM | POA: Diagnosis not present

## 2020-01-05 DIAGNOSIS — M6281 Muscle weakness (generalized): Secondary | ICD-10-CM | POA: Diagnosis not present

## 2020-01-20 DIAGNOSIS — Z23 Encounter for immunization: Secondary | ICD-10-CM | POA: Diagnosis not present

## 2020-01-27 DIAGNOSIS — Z79899 Other long term (current) drug therapy: Secondary | ICD-10-CM | POA: Diagnosis not present

## 2020-01-27 DIAGNOSIS — I10 Essential (primary) hypertension: Secondary | ICD-10-CM | POA: Diagnosis not present

## 2020-01-30 DIAGNOSIS — H353211 Exudative age-related macular degeneration, right eye, with active choroidal neovascularization: Secondary | ICD-10-CM | POA: Diagnosis not present

## 2020-02-01 DIAGNOSIS — H353221 Exudative age-related macular degeneration, left eye, with active choroidal neovascularization: Secondary | ICD-10-CM | POA: Diagnosis not present

## 2020-03-05 ENCOUNTER — Telehealth: Payer: Self-pay

## 2020-03-05 DIAGNOSIS — G118 Other hereditary ataxias: Secondary | ICD-10-CM

## 2020-03-05 NOTE — Telephone Encounter (Signed)
ok 

## 2020-03-05 NOTE — Telephone Encounter (Addendum)
Received fax from [facility] at [facility] requesting PT orders for functional mobility and safety training as well as strengthening and assist with tranfers.   If agreeable please send over orders to [phone].

## 2020-03-05 NOTE — Telephone Encounter (Signed)
Order placed and faxed

## 2020-03-13 DIAGNOSIS — R2681 Unsteadiness on feet: Secondary | ICD-10-CM | POA: Diagnosis not present

## 2020-03-13 DIAGNOSIS — G119 Hereditary ataxia, unspecified: Secondary | ICD-10-CM | POA: Diagnosis not present

## 2020-03-13 DIAGNOSIS — M6281 Muscle weakness (generalized): Secondary | ICD-10-CM | POA: Diagnosis not present

## 2020-03-15 DIAGNOSIS — R2681 Unsteadiness on feet: Secondary | ICD-10-CM | POA: Diagnosis not present

## 2020-03-15 DIAGNOSIS — M6281 Muscle weakness (generalized): Secondary | ICD-10-CM | POA: Diagnosis not present

## 2020-03-15 DIAGNOSIS — G119 Hereditary ataxia, unspecified: Secondary | ICD-10-CM | POA: Diagnosis not present

## 2020-03-19 DIAGNOSIS — H353211 Exudative age-related macular degeneration, right eye, with active choroidal neovascularization: Secondary | ICD-10-CM | POA: Diagnosis not present

## 2020-03-20 DIAGNOSIS — G119 Hereditary ataxia, unspecified: Secondary | ICD-10-CM | POA: Diagnosis not present

## 2020-03-20 DIAGNOSIS — M6281 Muscle weakness (generalized): Secondary | ICD-10-CM | POA: Diagnosis not present

## 2020-03-20 DIAGNOSIS — R2681 Unsteadiness on feet: Secondary | ICD-10-CM | POA: Diagnosis not present

## 2020-03-21 DIAGNOSIS — R2681 Unsteadiness on feet: Secondary | ICD-10-CM | POA: Diagnosis not present

## 2020-03-21 DIAGNOSIS — M6281 Muscle weakness (generalized): Secondary | ICD-10-CM | POA: Diagnosis not present

## 2020-03-21 DIAGNOSIS — G119 Hereditary ataxia, unspecified: Secondary | ICD-10-CM | POA: Diagnosis not present

## 2020-03-27 DIAGNOSIS — G119 Hereditary ataxia, unspecified: Secondary | ICD-10-CM | POA: Diagnosis not present

## 2020-03-27 DIAGNOSIS — M6281 Muscle weakness (generalized): Secondary | ICD-10-CM | POA: Diagnosis not present

## 2020-03-27 DIAGNOSIS — R2681 Unsteadiness on feet: Secondary | ICD-10-CM | POA: Diagnosis not present

## 2020-03-28 DIAGNOSIS — H353221 Exudative age-related macular degeneration, left eye, with active choroidal neovascularization: Secondary | ICD-10-CM | POA: Diagnosis not present

## 2020-04-05 DIAGNOSIS — M6281 Muscle weakness (generalized): Secondary | ICD-10-CM | POA: Diagnosis not present

## 2020-04-05 DIAGNOSIS — G119 Hereditary ataxia, unspecified: Secondary | ICD-10-CM | POA: Diagnosis not present

## 2020-04-05 DIAGNOSIS — R2681 Unsteadiness on feet: Secondary | ICD-10-CM | POA: Diagnosis not present

## 2020-04-10 DIAGNOSIS — M6281 Muscle weakness (generalized): Secondary | ICD-10-CM | POA: Diagnosis not present

## 2020-04-10 DIAGNOSIS — G119 Hereditary ataxia, unspecified: Secondary | ICD-10-CM | POA: Diagnosis not present

## 2020-04-10 DIAGNOSIS — R2681 Unsteadiness on feet: Secondary | ICD-10-CM | POA: Diagnosis not present

## 2020-04-12 DIAGNOSIS — M6281 Muscle weakness (generalized): Secondary | ICD-10-CM | POA: Diagnosis not present

## 2020-04-12 DIAGNOSIS — G119 Hereditary ataxia, unspecified: Secondary | ICD-10-CM | POA: Diagnosis not present

## 2020-04-12 DIAGNOSIS — R2681 Unsteadiness on feet: Secondary | ICD-10-CM | POA: Diagnosis not present

## 2020-04-17 DIAGNOSIS — G119 Hereditary ataxia, unspecified: Secondary | ICD-10-CM | POA: Diagnosis not present

## 2020-04-17 DIAGNOSIS — R2681 Unsteadiness on feet: Secondary | ICD-10-CM | POA: Diagnosis not present

## 2020-04-17 DIAGNOSIS — M6281 Muscle weakness (generalized): Secondary | ICD-10-CM | POA: Diagnosis not present

## 2020-04-19 DIAGNOSIS — G119 Hereditary ataxia, unspecified: Secondary | ICD-10-CM | POA: Diagnosis not present

## 2020-04-19 DIAGNOSIS — M6281 Muscle weakness (generalized): Secondary | ICD-10-CM | POA: Diagnosis not present

## 2020-04-19 DIAGNOSIS — R2681 Unsteadiness on feet: Secondary | ICD-10-CM | POA: Diagnosis not present

## 2020-04-24 DIAGNOSIS — M6281 Muscle weakness (generalized): Secondary | ICD-10-CM | POA: Diagnosis not present

## 2020-04-24 DIAGNOSIS — R2681 Unsteadiness on feet: Secondary | ICD-10-CM | POA: Diagnosis not present

## 2020-04-24 DIAGNOSIS — G119 Hereditary ataxia, unspecified: Secondary | ICD-10-CM | POA: Diagnosis not present

## 2020-04-26 DIAGNOSIS — R2681 Unsteadiness on feet: Secondary | ICD-10-CM | POA: Diagnosis not present

## 2020-04-26 DIAGNOSIS — G119 Hereditary ataxia, unspecified: Secondary | ICD-10-CM | POA: Diagnosis not present

## 2020-04-26 DIAGNOSIS — M6281 Muscle weakness (generalized): Secondary | ICD-10-CM | POA: Diagnosis not present

## 2020-05-14 DIAGNOSIS — H353211 Exudative age-related macular degeneration, right eye, with active choroidal neovascularization: Secondary | ICD-10-CM | POA: Diagnosis not present

## 2020-05-16 DIAGNOSIS — H353221 Exudative age-related macular degeneration, left eye, with active choroidal neovascularization: Secondary | ICD-10-CM | POA: Diagnosis not present

## 2020-06-27 DIAGNOSIS — H353221 Exudative age-related macular degeneration, left eye, with active choroidal neovascularization: Secondary | ICD-10-CM | POA: Diagnosis not present

## 2020-07-09 DIAGNOSIS — H353211 Exudative age-related macular degeneration, right eye, with active choroidal neovascularization: Secondary | ICD-10-CM | POA: Diagnosis not present

## 2020-07-23 DIAGNOSIS — I700 Atherosclerosis of aorta: Secondary | ICD-10-CM | POA: Diagnosis not present

## 2020-07-23 DIAGNOSIS — I10 Essential (primary) hypertension: Secondary | ICD-10-CM | POA: Diagnosis not present

## 2020-07-23 DIAGNOSIS — M79641 Pain in right hand: Secondary | ICD-10-CM | POA: Diagnosis not present

## 2020-07-23 DIAGNOSIS — E559 Vitamin D deficiency, unspecified: Secondary | ICD-10-CM | POA: Diagnosis not present

## 2020-07-23 DIAGNOSIS — Z0000 Encounter for general adult medical examination without abnormal findings: Secondary | ICD-10-CM | POA: Diagnosis not present

## 2020-07-23 DIAGNOSIS — Z79899 Other long term (current) drug therapy: Secondary | ICD-10-CM | POA: Diagnosis not present

## 2020-07-23 DIAGNOSIS — Z1389 Encounter for screening for other disorder: Secondary | ICD-10-CM | POA: Diagnosis not present

## 2020-07-23 DIAGNOSIS — G119 Hereditary ataxia, unspecified: Secondary | ICD-10-CM | POA: Diagnosis not present

## 2020-07-23 DIAGNOSIS — E7800 Pure hypercholesterolemia, unspecified: Secondary | ICD-10-CM | POA: Diagnosis not present

## 2020-07-26 DIAGNOSIS — Z23 Encounter for immunization: Secondary | ICD-10-CM | POA: Diagnosis not present

## 2020-08-15 DIAGNOSIS — H353221 Exudative age-related macular degeneration, left eye, with active choroidal neovascularization: Secondary | ICD-10-CM | POA: Diagnosis not present

## 2020-09-03 DIAGNOSIS — H353211 Exudative age-related macular degeneration, right eye, with active choroidal neovascularization: Secondary | ICD-10-CM | POA: Diagnosis not present

## 2020-10-16 ENCOUNTER — Ambulatory Visit: Payer: Medicare Other | Admitting: Neurology

## 2020-10-22 DIAGNOSIS — H353211 Exudative age-related macular degeneration, right eye, with active choroidal neovascularization: Secondary | ICD-10-CM | POA: Diagnosis not present

## 2020-10-26 ENCOUNTER — Ambulatory Visit: Payer: Medicare Other | Admitting: Neurology

## 2020-12-20 DIAGNOSIS — H353211 Exudative age-related macular degeneration, right eye, with active choroidal neovascularization: Secondary | ICD-10-CM | POA: Diagnosis not present

## 2020-12-20 DIAGNOSIS — H353231 Exudative age-related macular degeneration, bilateral, with active choroidal neovascularization: Secondary | ICD-10-CM | POA: Diagnosis not present

## 2021-02-06 DIAGNOSIS — Z13820 Encounter for screening for osteoporosis: Secondary | ICD-10-CM | POA: Diagnosis not present

## 2021-02-06 DIAGNOSIS — N3281 Overactive bladder: Secondary | ICD-10-CM | POA: Diagnosis not present

## 2021-02-06 DIAGNOSIS — G119 Hereditary ataxia, unspecified: Secondary | ICD-10-CM | POA: Diagnosis not present

## 2021-02-06 DIAGNOSIS — Z0000 Encounter for general adult medical examination without abnormal findings: Secondary | ICD-10-CM | POA: Diagnosis not present

## 2021-02-06 DIAGNOSIS — E785 Hyperlipidemia, unspecified: Secondary | ICD-10-CM | POA: Diagnosis not present

## 2021-02-06 DIAGNOSIS — Z780 Asymptomatic menopausal state: Secondary | ICD-10-CM | POA: Diagnosis not present

## 2021-02-06 DIAGNOSIS — Z23 Encounter for immunization: Secondary | ICD-10-CM | POA: Diagnosis not present

## 2021-02-06 DIAGNOSIS — I10 Essential (primary) hypertension: Secondary | ICD-10-CM | POA: Diagnosis not present

## 2021-02-18 DIAGNOSIS — H353231 Exudative age-related macular degeneration, bilateral, with active choroidal neovascularization: Secondary | ICD-10-CM | POA: Diagnosis not present

## 2021-04-11 DIAGNOSIS — H353231 Exudative age-related macular degeneration, bilateral, with active choroidal neovascularization: Secondary | ICD-10-CM | POA: Diagnosis not present

## 2021-04-11 DIAGNOSIS — H353211 Exudative age-related macular degeneration, right eye, with active choroidal neovascularization: Secondary | ICD-10-CM | POA: Diagnosis not present

## 2021-04-25 DIAGNOSIS — H6121 Impacted cerumen, right ear: Secondary | ICD-10-CM | POA: Diagnosis not present

## 2021-06-10 DIAGNOSIS — H353231 Exudative age-related macular degeneration, bilateral, with active choroidal neovascularization: Secondary | ICD-10-CM | POA: Diagnosis not present

## 2021-06-17 DIAGNOSIS — G118 Other hereditary ataxias: Secondary | ICD-10-CM | POA: Diagnosis not present

## 2021-06-25 DIAGNOSIS — Z20822 Contact with and (suspected) exposure to covid-19: Secondary | ICD-10-CM | POA: Diagnosis not present

## 2021-07-16 DIAGNOSIS — Z13820 Encounter for screening for osteoporosis: Secondary | ICD-10-CM | POA: Diagnosis not present

## 2021-07-16 DIAGNOSIS — M8580 Other specified disorders of bone density and structure, unspecified site: Secondary | ICD-10-CM | POA: Diagnosis not present

## 2021-07-16 DIAGNOSIS — M85852 Other specified disorders of bone density and structure, left thigh: Secondary | ICD-10-CM | POA: Diagnosis not present

## 2021-07-16 DIAGNOSIS — Z780 Asymptomatic menopausal state: Secondary | ICD-10-CM | POA: Diagnosis not present

## 2021-08-05 DIAGNOSIS — H353231 Exudative age-related macular degeneration, bilateral, with active choroidal neovascularization: Secondary | ICD-10-CM | POA: Diagnosis not present

## 2021-08-15 DIAGNOSIS — Z20822 Contact with and (suspected) exposure to covid-19: Secondary | ICD-10-CM | POA: Diagnosis not present
